# Patient Record
Sex: Female | Born: 1940 | Race: White | Hispanic: No | Smoking: Never smoker
Health system: Southern US, Community
[De-identification: ages and names within clinical notes are randomized; demographics above are authoritative.]

## PROBLEM LIST (undated history)

## (undated) DIAGNOSIS — M1990 Unspecified osteoarthritis, unspecified site: Secondary | ICD-10-CM

## (undated) DIAGNOSIS — Z9109 Other allergy status, other than to drugs and biological substances: Secondary | ICD-10-CM

## (undated) DIAGNOSIS — T4145XA Adverse effect of unspecified anesthetic, initial encounter: Secondary | ICD-10-CM

## (undated) DIAGNOSIS — G2581 Restless legs syndrome: Secondary | ICD-10-CM

## (undated) DIAGNOSIS — M8580 Other specified disorders of bone density and structure, unspecified site: Secondary | ICD-10-CM

## (undated) DIAGNOSIS — F419 Anxiety disorder, unspecified: Secondary | ICD-10-CM

## (undated) DIAGNOSIS — G4700 Insomnia, unspecified: Secondary | ICD-10-CM

## (undated) DIAGNOSIS — N393 Stress incontinence (female) (male): Secondary | ICD-10-CM

## (undated) DIAGNOSIS — T8859XA Other complications of anesthesia, initial encounter: Secondary | ICD-10-CM

## (undated) DIAGNOSIS — H269 Unspecified cataract: Secondary | ICD-10-CM

## (undated) DIAGNOSIS — C4491 Basal cell carcinoma of skin, unspecified: Secondary | ICD-10-CM

## (undated) DIAGNOSIS — D649 Anemia, unspecified: Secondary | ICD-10-CM

## (undated) HISTORY — DX: Unspecified osteoarthritis, unspecified site: M19.90

## (undated) HISTORY — DX: Insomnia, unspecified: G47.00

## (undated) HISTORY — PX: EYE SURGERY: SHX253

## (undated) HISTORY — DX: Other allergy status, other than to drugs and biological substances: Z91.09

## (undated) HISTORY — PX: TONSILLECTOMY: SUR1361

## (undated) HISTORY — DX: Basal cell carcinoma of skin, unspecified: C44.91

## (undated) HISTORY — PX: COLONOSCOPY: SHX174

## (undated) HISTORY — DX: Stress incontinence (female) (male): N39.3

## (undated) HISTORY — DX: Unspecified cataract: H26.9

## (undated) HISTORY — PX: SKIN CANCER EXCISION: SHX779

## (undated) HISTORY — DX: Restless legs syndrome: G25.81

---
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
## 2007-10-01 DIAGNOSIS — M899 Disorder of bone, unspecified: Secondary | ICD-10-CM | POA: Insufficient documentation

## 2007-10-01 DIAGNOSIS — J301 Allergic rhinitis due to pollen: Secondary | ICD-10-CM | POA: Insufficient documentation

## 2007-10-01 DIAGNOSIS — G5600 Carpal tunnel syndrome, unspecified upper limb: Secondary | ICD-10-CM | POA: Insufficient documentation

## 2012-03-02 DIAGNOSIS — H9209 Otalgia, unspecified ear: Secondary | ICD-10-CM | POA: Insufficient documentation

## 2012-03-02 DIAGNOSIS — R42 Dizziness and giddiness: Secondary | ICD-10-CM | POA: Insufficient documentation

## 2012-06-18 DIAGNOSIS — H269 Unspecified cataract: Secondary | ICD-10-CM | POA: Insufficient documentation

## 2012-09-06 LAB — HM COLONOSCOPY

## 2014-04-20 ENCOUNTER — Ambulatory Visit: Payer: Self-pay | Admitting: Obstetrics and Gynecology

## 2014-05-30 DIAGNOSIS — Z66 Do not resuscitate: Secondary | ICD-10-CM | POA: Insufficient documentation

## 2015-08-05 LAB — HM PAP SMEAR: HM Pap smear: NORMAL

## 2015-08-29 ENCOUNTER — Other Ambulatory Visit: Payer: Self-pay | Admitting: Obstetrics and Gynecology

## 2015-08-29 DIAGNOSIS — Z13820 Encounter for screening for osteoporosis: Secondary | ICD-10-CM

## 2015-08-29 DIAGNOSIS — Z1239 Encounter for other screening for malignant neoplasm of breast: Secondary | ICD-10-CM

## 2015-09-11 ENCOUNTER — Ambulatory Visit
Admission: RE | Admit: 2015-09-11 | Discharge: 2015-09-11 | Disposition: A | Discharge status: Home / Self Care | Payer: Medicare Other | Source: Ambulatory Visit | Attending: Obstetrics and Gynecology | Admitting: Obstetrics and Gynecology

## 2015-09-11 ENCOUNTER — Other Ambulatory Visit: Payer: Self-pay | Admitting: Obstetrics and Gynecology

## 2015-09-11 DIAGNOSIS — Z1231 Encounter for screening mammogram for malignant neoplasm of breast: Secondary | ICD-10-CM

## 2015-09-11 DIAGNOSIS — M8580 Other specified disorders of bone density and structure, unspecified site: Secondary | ICD-10-CM | POA: Diagnosis not present

## 2015-09-11 DIAGNOSIS — Z13820 Encounter for screening for osteoporosis: Secondary | ICD-10-CM | POA: Diagnosis present

## 2015-09-11 DIAGNOSIS — Z1239 Encounter for other screening for malignant neoplasm of breast: Secondary | ICD-10-CM

## 2016-02-25 DIAGNOSIS — C4491 Basal cell carcinoma of skin, unspecified: Secondary | ICD-10-CM

## 2016-02-25 HISTORY — DX: Basal cell carcinoma of skin, unspecified: C44.91

## 2016-08-27 ENCOUNTER — Other Ambulatory Visit: Payer: Self-pay | Admitting: Family Medicine

## 2016-08-27 DIAGNOSIS — Z1231 Encounter for screening mammogram for malignant neoplasm of breast: Secondary | ICD-10-CM

## 2016-11-26 ENCOUNTER — Ambulatory Visit
Admission: RE | Admit: 2016-11-26 | Discharge: 2016-11-26 | Disposition: A | Discharge status: Home / Self Care | Payer: Medicare Other | Source: Ambulatory Visit | Attending: Family Medicine | Admitting: Family Medicine

## 2016-11-26 DIAGNOSIS — Z1231 Encounter for screening mammogram for malignant neoplasm of breast: Secondary | ICD-10-CM | POA: Insufficient documentation

## 2016-11-26 LAB — HM MAMMOGRAPHY

## 2016-12-31 ENCOUNTER — Encounter (INDEPENDENT_AMBULATORY_CARE_PROVIDER_SITE_OTHER): Payer: Self-pay

## 2016-12-31 ENCOUNTER — Ambulatory Visit (INDEPENDENT_AMBULATORY_CARE_PROVIDER_SITE_OTHER): Payer: Medicare Other | Admitting: Primary Care

## 2016-12-31 ENCOUNTER — Encounter: Payer: Self-pay | Admitting: Primary Care

## 2016-12-31 VITALS — BP 116/72 | HR 78 | Temp 97.5°F | Ht 65.75 in | Wt 153.5 lb

## 2016-12-31 DIAGNOSIS — G2581 Restless legs syndrome: Secondary | ICD-10-CM | POA: Diagnosis not present

## 2016-12-31 DIAGNOSIS — G4701 Insomnia due to medical condition: Secondary | ICD-10-CM

## 2016-12-31 DIAGNOSIS — N393 Stress incontinence (female) (male): Secondary | ICD-10-CM | POA: Diagnosis not present

## 2016-12-31 DIAGNOSIS — M2550 Pain in unspecified joint: Secondary | ICD-10-CM

## 2016-12-31 DIAGNOSIS — R32 Unspecified urinary incontinence: Secondary | ICD-10-CM | POA: Insufficient documentation

## 2016-12-31 DIAGNOSIS — G4700 Insomnia, unspecified: Secondary | ICD-10-CM | POA: Insufficient documentation

## 2016-12-31 MED ORDER — ZOLPIDEM TARTRATE ER 6.25 MG PO TBCR: 6.2500 mg | EXTENDED_RELEASE_TABLET | Freq: Every evening | ORAL | 0 refills | Status: DC | PRN

## 2016-12-31 MED ORDER — TRAMADOL HCL 50 MG PO TABS: 50.0000 mg | ORAL_TABLET | Freq: Four times a day (QID) | ORAL | 0 refills | Status: DC | PRN

## 2016-12-31 MED ORDER — GABAPENTIN 300 MG PO CAPS: 1200.0000 mg | ORAL_CAPSULE | Freq: Every day | ORAL | 0 refills | Status: DC

## 2016-12-31 MED ORDER — PRAMIPEXOLE DIHYDROCHLORIDE ER 0.375 MG PO TB24: 1.0000 | ORAL_TABLET | Freq: Every day | ORAL | 0 refills | Status: DC

## 2016-12-31 MED ORDER — ALPRAZOLAM 0.5 MG PO TABS: 0.5000 mg | ORAL_TABLET | Freq: Every evening | ORAL | 0 refills | Status: DC | PRN

## 2016-12-31 NOTE — Assessment & Plan Note (Signed)
History of for years, attempted oxybutynin and several other medications without improvement. Will try low-dose Myrbetric. If no improvement then will send to Urology.

## 2016-12-31 NOTE — Assessment & Plan Note (Signed)
Secondary to restless leg syndrome. Discussed never to use Xanax and Ambien together, discouraged use of both. Based off of her bottles and last fill date, she uses these medications infrequently. Refill provided today for short-term supply. Would not recommend she continue to take these medicines given long-term history and tolerance of other opioids.

## 2016-12-31 NOTE — Progress Notes (Signed)
Subjective:    Patient ID: Brittany Browning, female    DOB: 28-Dec-1940, 76 y.o.   MRN: [id number]  HPI  Brittany Browning is a 76 year old female who presents today to establish care and discuss the problems mentioned below. Will obtain old records. Her last [place] was in January 2018.  1) Insomnia: Currently managed on alprazolam 0.5 mg once nightly as needed for sleep and Ambien CR 6.25 mg. She takes 1/2 tablet of alprazolam 2-3 times weekly along with Tussionex. She takes Ambien once weekly on average. She is taken these medicines on an as-needed basis for years. She uses them infrequently overall, but is needing a refill today.  2) Restless Leg Syndrome: Diagnosed over 20 years ago. Currently managed on Mirapex 0.375 ER tablets, Ultracet/Ultram, Gabapentin 300mg . She recently underwent evaluation per pulmonology in [place] who her on a regimen of methadone and hydrocodone in order to restart her regimen as she's had little improvement to RLS symptoms. After miscommunication within the office she decided to leave their practice and establish with [name]. She is currently set up to see Neurology through [facility] on July 20th, 2018.  3) Stress Incontinence: Daily symptoms. Currently following with OB/GYN and taking estradiol 10 mg twice weekly. Previously managed on ditropan without much improvement. Her symptoms of incontinence have progressed gradually, she is interested in restarting treatment.  4) Shoulder Pain: Located to right shoulder for about one year. No recent injury or trauma. She has been evaluated by PT through [facility] and is needing a prescription to continue treatment.   5) Left lower extremity pain/knee pain: Present for the past 10 days. She presented to [facility] and underwent xrays without obvious cause for pain. She does have a history of restless leg syndrome. She would like to see the physical therapist at [facility] for both her shoulder and left lower extremity.  Review  of Systems  Constitutional: Negative for fatigue.  Respiratory: Negative for shortness of breath.   Cardiovascular: Negative for chest pain.  Gastrointestinal: Negative for abdominal pain.  Genitourinary:       Incontinence  Musculoskeletal: Positive for arthralgias.  Allergic/Immunologic: Positive for environmental allergies.  Neurological:       Restless leg syndrome  Hematological: Negative for adenopathy.  Psychiatric/Behavioral: The patient is not nervous/anxious.        Intermittent insomnia secondary to RLS       No past medical history on file.   Social History   Social History  . Marital status: Married    Spouse name: N/A  . Number of children: N/A  . Years of education: N/A   Occupational History  . Not on file.   Social History Main Topics  . Smoking status: Never Smoker  . Smokeless tobacco: Never Used  . Alcohol use No  . Drug use: No  . Sexual activity: Not on file   Other Topics Concern  . Not on file   Social History Narrative  . No narrative on file    No past surgical history on file.  Family History  Problem Relation Age of Onset  . Breast cancer Neg Hx     Allergies  Allergen Reactions  . Sulfa Antibiotics     Reaction in the past (unknown)    No current outpatient prescriptions on file prior to visit.   No current facility-administered medications on file prior to visit.     BP 116/72 (BP Location: Right Arm, Patient Position: Sitting, Cuff Size: Normal)  Pulse 78   Temp 97.5 F (36.4 C) (Oral)   Ht 5' 5.75" (1.67 m)   Wt 153 lb 8 oz (69.6 kg)   SpO2 96%   BMI 24.96 kg/m    Objective:   Physical Exam  Constitutional: She is oriented to person, place, and time. She appears well-nourished.  Neck: Neck supple.  Cardiovascular: Normal rate and regular rhythm.   Pulmonary/Chest: Effort normal and breath sounds normal.  Musculoskeletal:       Right shoulder: She exhibits decreased range of motion and pain. She exhibits  no tenderness.       Left knee: She exhibits decreased range of motion. She exhibits no swelling. No tenderness found.  Neurological: She is alert and oriented to person, place, and time.  Skin: Skin is warm and dry.  Psychiatric: She has a normal mood and affect.          Assessment & Plan:

## 2016-12-31 NOTE — Patient Instructions (Addendum)
Stop by the front desk and speak with either Rosaria Ferries or Shirlean Mylar regarding your referral to Neurology and pain management.  You do not need any additional pneumonia vaccinations.  Take the prescription to the physical therapy department.  Talk with pain management and neurology regarding your current regimen. They will need to take over.  It was a pleasure to meet you today! Please don't hesitate to call me with any questions. Welcome to [facility]!

## 2016-12-31 NOTE — Assessment & Plan Note (Signed)
Located to right shoulder and left knee. Exam overall stable today, prescription for physical therapy provided for her today. She will participate in physical therapy through [facility].

## 2016-12-31 NOTE — Assessment & Plan Note (Signed)
Chronic for 20+ years. Long-term use of Ultram, gabapentin, mirtazapine. Do agree that she needs neurology and/or pain management evaluation for ongoing symptoms despite treatment regimen. Recommended she keep her appointment with neurology in July, referral for pain management placed today. Discussed that I do not prescribe narcotics long-term, she verbalized understanding.

## 2017-01-22 ENCOUNTER — Ambulatory Visit: Payer: Self-pay | Admitting: Neurology

## 2017-02-08 ENCOUNTER — Other Ambulatory Visit: Payer: Self-pay | Admitting: Primary Care

## 2017-02-08 DIAGNOSIS — G2581 Restless legs syndrome: Secondary | ICD-10-CM

## 2017-02-09 NOTE — Telephone Encounter (Signed)
Ok to refill? Electronically refill request for gabapentin (NEURONTIN) 300 MG capsule.  Last prescribed and seen on 12/31/2016.

## 2017-02-09 NOTE — Telephone Encounter (Signed)
Looks like she's currently following with someone through [place]. Please verify this as we received a request for her Gabapentin. This will need to go to her MD at [facility].

## 2017-02-10 NOTE — Telephone Encounter (Signed)
Per DPR, left detail message of Kate's comments for patient to call back. 

## 2017-02-25 ENCOUNTER — Other Ambulatory Visit: Payer: Self-pay | Admitting: Primary Care

## 2017-02-25 DIAGNOSIS — G2581 Restless legs syndrome: Secondary | ICD-10-CM

## 2017-02-25 NOTE — Telephone Encounter (Signed)
Ok to refill? Electronically refill request for Pramipexole Dihydrochloride 0.375 MG TB24  Last prescribed and seen on 12/31/2016.

## 2017-02-25 NOTE — Telephone Encounter (Signed)
This request needs to go to PCP at [facility]. See Care Everywhere for 02/03/17 visit.

## 2017-02-26 ENCOUNTER — Other Ambulatory Visit: Payer: Self-pay | Admitting: Primary Care

## 2017-02-26 DIAGNOSIS — G2581 Restless legs syndrome: Secondary | ICD-10-CM

## 2017-02-26 NOTE — Telephone Encounter (Signed)
Message left for patient to return my call.  

## 2017-03-03 NOTE — Telephone Encounter (Signed)
Message left for patient to return my call.  

## 2017-06-01 ENCOUNTER — Emergency Department
Admission: EM | Admit: 2017-06-01 | Discharge: 2017-06-02 | Disposition: A | Discharge status: Home / Self Care | Payer: Medicare Other | Attending: Emergency Medicine | Admitting: Emergency Medicine

## 2017-06-01 DIAGNOSIS — R110 Nausea: Secondary | ICD-10-CM | POA: Insufficient documentation

## 2017-06-01 DIAGNOSIS — T50905A Adverse effect of unspecified drugs, medicaments and biological substances, initial encounter: Secondary | ICD-10-CM | POA: Diagnosis not present

## 2017-06-01 DIAGNOSIS — Z7982 Long term (current) use of aspirin: Secondary | ICD-10-CM | POA: Diagnosis not present

## 2017-06-01 DIAGNOSIS — Z85828 Personal history of other malignant neoplasm of skin: Secondary | ICD-10-CM | POA: Insufficient documentation

## 2017-06-01 DIAGNOSIS — Y829 Unspecified medical devices associated with adverse incidents: Secondary | ICD-10-CM | POA: Diagnosis not present

## 2017-06-01 DIAGNOSIS — Z79899 Other long term (current) drug therapy: Secondary | ICD-10-CM | POA: Insufficient documentation

## 2017-06-01 DIAGNOSIS — F419 Anxiety disorder, unspecified: Secondary | ICD-10-CM | POA: Diagnosis not present

## 2017-06-01 DIAGNOSIS — T887XXA Unspecified adverse effect of drug or medicament, initial encounter: Secondary | ICD-10-CM | POA: Insufficient documentation

## 2017-06-01 NOTE — ED Provider Notes (Signed)
[facility] Emergency Department Provider Note   ____________________________________________   First MD Initiated Contact with Patient 06/01/17 2351     (approximate)  I have reviewed the triage vital signs and the nursing notes.   HISTORY  Chief Complaint Medication Reaction    HPI Brittany Browning is a 76 y.o. female here for evaluation of feeling "horrible"  Patient reports that she has been on Wellbutrin for about 4 days, she took 2 tablets but today one this morning and 1 this afternoon is developed an uneasy feeling, but she is felt slightly more anxious than usual, had some mild nausea, and reports that this feels very 'off.'  She cannot describe better.  She was recently started on Wellbutrin and methadone for treatment of anxiety and restless leg syndrome.  She reports she suffers with restless legs for her entire life, been seen by multiple specialists, and is currently being followed at [facility]  No headache.  No chest pain.  No abdominal pain.  No tremors.  No shakes.  No hallucinations, not hearing anything.  Denies thoughts about hurting anyone   Past Medical History:  Diagnosis Date  . Arthritis   . Basal cell carcinoma (BCC)   . Cataract   . Environmental allergies   . Insomnia   . Restless leg syndrome   . Stress incontinence     Patient Active Problem List   Diagnosis Date Noted  . Restless leg syndrome 12/31/2016  . Insomnia 12/31/2016  . Urinary incontinence 12/31/2016  . Arthralgia 12/31/2016    Past Surgical History:  Procedure Laterality Date  . TONSILLECTOMY AND ADENOIDECTOMY  1944    Prior to Admission medications   Medication Sig Start Date End Date Taking? Authorizing Provider  5-Hydroxytryptophan (5-HTP) 100 MG CAPS Take 1 tablet by mouth daily.    [provider]  ALPRAZolam Duanne Moron) 0.5 MG tablet Take 1 tablet (0.5 mg total) by mouth at bedtime as needed for sleep. 12/31/16   Pleas Koch, NP  aspirin EC 81 MG tablet Take 1 tablet by mouth 2 (two) times a week. 10/18/07   [provider]  Azelastine HCl 0.15 % SOLN Place into the nose. Place 2 sprays into both nostrils nightly 02/06/16   [provider]  Bioflavonoid Products (ESTER-C) TABS Take 1 tablet by mouth daily.    [provider]  Calcium Carbonate-Vit D-Min (CALCIUM 1200 PO) Take 1 tablet by mouth 2 (two) times daily.    [provider]  Cinnamon 500 MG capsule Take 1 capsule by mouth daily.    [provider]  Coenzyme Q-10 200 MG CAPS Take 1 tablet by mouth daily.    [provider]  Cyanocobalamin 2500 MCG SUBL Place 1 tablet under the tongue daily.    [provider]  Dextromethorphan-Guaifenesin (MUCINEX DM PO) Take 1 tablet by mouth daily as needed.    [provider]  Docusate Calcium (STOOL SOFTENER PO) Take 1 tablet by mouth daily.    [provider]  Estradiol 10 MCG TABS vaginal tablet Place 1 tablet vaginally 2 (two) times a week. 12/19/09   [provider]  folic acid (FOLVITE) 1 MG tablet Take 1 tablet by mouth daily. 10/18/07   [provider]  gabapentin (NEURONTIN) 300 MG capsule Take 4 capsules (1,200 mg total) by mouth at bedtime. 12/31/16   Pleas Koch, NP  Gabapentin Enacarbil 600 MG TBCR Take 1 tablet by mouth daily. 07/09/16   [provider]  Glucosamine-Chondroitin (COSAMIN DS PO) Take 1 tablet by mouth daily.    [provider]  GLUTATHIONE PO Take 1 tablet by mouth daily.    [provider]  HYDROcodone-acetaminophen (NORCO/VICODIN) 5-325 MG tablet hydrocodone 5 mg-acetaminophen 325 mg tablet.  TAKE 1 TABLET BY MOUTH EVERY 12 HOURS AS NEEDED    [provider]  Hypertonic Nasal Wash (SINUS RINSE NA) Place 1 application into the nose daily.    [provider]  ipratropium (ATROVENT HFA) 17 MCG/ACT inhaler Inhale 2 puffs into the lungs every 6  (six) hours.    [provider]  ketotifen (ZADITOR) 0.025 % ophthalmic solution Apply 1 drop to eye 2 (two) times daily as needed.    [provider]  levocetirizine (XYZAL) 5 MG tablet Take 1 tablet by mouth 2 (two) times daily. 02/06/16 02/05/17  [provider]  MAGNESIUM GLUCONATE PO Take 400 mg by mouth 2 (two) times daily.    [provider]  meclizine (ANTIVERT) 12.5 MG tablet Take 1 tablet by mouth 3 (three) times daily as needed.    [provider]  Melatonin 10 MG TABS Take 1 tablet by mouth daily.    [provider]  methadone (DOLOPHINE) 5 MG tablet Take 1 tablet by mouth daily. 12/02/16   [provider]  montelukast (SINGULAIR) 10 MG tablet Take 1 tablet by mouth daily. 10/14/10   [provider]  Multiple Vitamin (MULTIVITAMIN) capsule Take 1 capsule by mouth daily. 10/30/08   [provider]  naproxen (NAPROSYN) 375 MG tablet Take 375 mg by mouth 2 (two) times daily as needed. 12/20/16   [provider]  NONFORMULARY OR COMPOUNDED ITEM Cream with cannabis oil    [provider]  Omega-3 1000 MG CAPS Take 2 tablets by mouth daily. 10/18/07   [provider]  phenazopyridine (PYRIDIUM) 200 MG tablet Take 1 tablet by mouth 2 (two) times daily.    [provider]  Polyethyl Glycol-Propyl Glycol (SYSTANE ULTRA OP) Apply to eye.    [provider]  Pramipexole Dihydrochloride 0.375 MG TB24 Take 1 tablet (0.375 mg total) by mouth daily. 12/31/16   Pleas Koch, NP  sodium citrate-citric acid (ORACIT) 500-334 MG/5ML solution Take 1 mL by mouth daily. 04/25/13   [provider]  traMADol (ULTRAM) 50 MG tablet Take 1 tablet (50 mg total) by mouth every 6 (six) hours as needed for severe pain. 12/31/16   Pleas Koch, NP  traMADol-acetaminophen (ULTRACET) 37.5-325 MG tablet Take 1 tablet by mouth at bedtime.    [provider]  zolpidem (AMBIEN CR)  6.25 MG CR tablet Take 1 tablet (6.25 mg total) by mouth at bedtime as needed for sleep. 12/31/16   Pleas Koch, NP    Allergies Sulfa antibiotics  Family History  Problem Relation Age of Onset  . Breast cancer Neg Hx     Social History Social History  Substance Use Topics  . Smoking status: Never Smoker  . Smokeless tobacco: Never Used  . Alcohol use No    Review of Systems Constitutional: No fever/chills.  Patient reports hard to describe feeling of anxiety Eyes: No visual changes. ENT: No sore throat. Cardiovascular: Denies chest pain. Respiratory: Denies shortness of breath. Gastrointestinal: No abdominal pain.  No vomiting.  No diarrhea.  No constipation. Genitourinary: Negative for dysuria. Musculoskeletal: Negative for back pain. Skin: Negative for rash. Neurological: Negative for headaches, focal weakness or numbness.    ____________________________________________  PHYSICAL EXAM:  VITAL SIGNS: ED Triage Vitals  Enc Vitals Group     BP 06/01/17 2032 127/75     Pulse Rate 06/01/17 2032 (!) 103     Resp 06/01/17 2032 20     Temp 06/01/17 2032 98 F (36.7 C)     Temp Source 06/01/17 2032 Oral     SpO2 06/01/17 2032 94 %     Weight 06/01/17 2033 148 lb (67.1 kg)     Height 06/01/17 2033 5\' 6"  (1.676 m)     Head Circumference --      Peak Flow --      Pain Score --      Pain Loc --      Pain Edu? --      Excl. in [place]? --     Constitutional: Alert and oriented. Well appearing and in no acute distress.  Sitting up, pleasant, appears slightly anxious. Eyes: Conjunctivae are normal. Head: Atraumatic. Nose: No congestion/rhinnorhea. Mouth/Throat: Mucous membranes are moist. Neck: No stridor.   Cardiovascular: Normal rate, regular rhythm. Grossly normal heart sounds.  Good peripheral circulation. Respiratory: Normal respiratory effort.  No retractions. Lungs CTAB. Gastrointestinal: Soft and nontender. No distention. Musculoskeletal: No lower  extremity tenderness nor edema.  No tremors in any extremity.  Normal patellar reflexes bilateral.  No mild clonus.  No ataxia.  No pronator drift.  Walks with normal gait. Neurologic:  Normal speech and language. No gross focal neurologic deficits are appreciated.  Normal cranial nerve exam.  Normal strength in all extremities. Skin:  Skin is warm, dry and intact. No rash noted. Psychiatric: Mood and affect are normal to slightly anxious. Speech and behavior are normal.  ____________________________________________   LABS (all labs ordered are listed, but only abnormal results are displayed)  Labs Reviewed - No data to display ____________________________________________  EKG  Requested and recommended performing EKG the patient, she refused this.  She reports she does not wish for any testing, just to speak to me. ____________________________________________  RADIOLOGY   ____________________________________________   PROCEDURES  Procedure(s) performed: None  Procedures  Critical Care performed: No  ____________________________________________   INITIAL IMPRESSION / ASSESSMENT AND PLAN / ED COURSE  Pertinent labs & imaging results that were available during my care of the patient were reviewed by me and considered in my medical decision making (see chart for details).  Patient presents after developing feeling of anxiety and mild nausea after recently starting Wellbutrin and methadone.  Very reassuring exam without evidence of acute or emergent medical condition.  Patient did not wish for an EKG which I was going to perform to assure no ECG abnormalities, but after discussing with the patient she does not wish to have this done and reassured medical decision making we will not perform.  Counseled the patient, and I suspect this is likely secondary to recently initiating Wellbutrin given the feeling of hard to describe feeling of discomfort, anxiety and some nausea which  are common side effects of Wellbutrin.  Discussed with her as well as her healthcare assistant who is with her, advised her to discontinue Wellbutrin use, advised on careful return precautions, and she will be calling her doctor this morning to obtain further guidance and recommendations regarding her care.  Return precautions and treatment recommendations and follow-up discussed with the patient who is agreeable with the plan.           ____________________________________________   FINAL CLINICAL IMPRESSION(S) / ED DIAGNOSES  Final diagnoses:  Medication side  effect, initial encounter      NEW MEDICATIONS STARTED DURING THIS VISIT:  New Prescriptions   No medications on file     Note:  This document was prepared using Dragon voice recognition software and may include unintentional dictation errors.     Delman Kitten, MD 06/02/17 [phone]

## 2017-06-01 NOTE — ED Triage Notes (Signed)
Patient reports feels she is having a reaction to her medications.  Reports recently started a new medication.  Patient reports "I feel horrible".

## 2017-06-02 NOTE — Discharge Instructions (Signed)
Please call your regular doctor (who prescribed your Wellbutrin) tomorrow morning.  Call your doctor or return to the ED if you have headache, sudden and severe headache, confusion, slurred speech, facial droop, weakness or numbness in any arm or leg, extreme fatigue, vision problems, or other symptoms that concern you.

## 2017-06-02 NOTE — ED Notes (Signed)
Pt told this NA that she does not want an EKG done. Pt states that she has had one done recently and that she thinks her heart is fine.

## 2017-06-23 ENCOUNTER — Ambulatory Visit (INDEPENDENT_AMBULATORY_CARE_PROVIDER_SITE_OTHER): Payer: Medicare Other | Admitting: Family Medicine

## 2017-06-23 ENCOUNTER — Encounter: Payer: Self-pay | Admitting: Family Medicine

## 2017-06-23 VITALS — BP 122/78 | HR 78 | Resp 16 | Ht 66.0 in | Wt 148.0 lb

## 2017-06-23 DIAGNOSIS — G4701 Insomnia due to medical condition: Secondary | ICD-10-CM

## 2017-06-23 DIAGNOSIS — F439 Reaction to severe stress, unspecified: Secondary | ICD-10-CM | POA: Insufficient documentation

## 2017-06-23 DIAGNOSIS — G8929 Other chronic pain: Secondary | ICD-10-CM | POA: Insufficient documentation

## 2017-06-23 DIAGNOSIS — M810 Age-related osteoporosis without current pathological fracture: Secondary | ICD-10-CM | POA: Insufficient documentation

## 2017-06-23 DIAGNOSIS — M549 Dorsalgia, unspecified: Secondary | ICD-10-CM

## 2017-06-23 DIAGNOSIS — E559 Vitamin D deficiency, unspecified: Secondary | ICD-10-CM | POA: Insufficient documentation

## 2017-06-23 DIAGNOSIS — N393 Stress incontinence (female) (male): Secondary | ICD-10-CM | POA: Insufficient documentation

## 2017-06-23 DIAGNOSIS — F419 Anxiety disorder, unspecified: Secondary | ICD-10-CM | POA: Diagnosis not present

## 2017-06-23 DIAGNOSIS — G2581 Restless legs syndrome: Secondary | ICD-10-CM | POA: Diagnosis not present

## 2017-06-23 DIAGNOSIS — J309 Allergic rhinitis, unspecified: Secondary | ICD-10-CM

## 2017-06-23 DIAGNOSIS — R2681 Unsteadiness on feet: Secondary | ICD-10-CM | POA: Diagnosis not present

## 2017-06-23 DIAGNOSIS — M1990 Unspecified osteoarthritis, unspecified site: Secondary | ICD-10-CM | POA: Insufficient documentation

## 2017-06-23 DIAGNOSIS — E7800 Pure hypercholesterolemia, unspecified: Secondary | ICD-10-CM | POA: Insufficient documentation

## 2017-06-23 DIAGNOSIS — R0789 Other chest pain: Secondary | ICD-10-CM | POA: Insufficient documentation

## 2017-06-23 MED ORDER — TRAMADOL HCL 50 MG PO TABS: 50.0000 mg | ORAL_TABLET | Freq: Three times a day (TID) | ORAL | 0 refills | Status: DC | PRN

## 2017-06-23 NOTE — Patient Instructions (Signed)
Take methadone 2.5 mg in the morning and 5 mg before bedtime.

## 2017-06-24 ENCOUNTER — Telehealth: Payer: Self-pay | Admitting: Family Medicine

## 2017-06-24 ENCOUNTER — Other Ambulatory Visit: Payer: Self-pay | Admitting: Family Medicine

## 2017-06-24 ENCOUNTER — Emergency Department
Admission: EM | Admit: 2017-06-24 | Discharge: 2017-06-24 | Disposition: A | Discharge status: Home / Self Care | Payer: Medicare Other | Attending: Emergency Medicine | Admitting: Emergency Medicine

## 2017-06-24 ENCOUNTER — Other Ambulatory Visit: Payer: Self-pay

## 2017-06-24 ENCOUNTER — Encounter: Payer: Self-pay | Admitting: Emergency Medicine

## 2017-06-24 DIAGNOSIS — Z85828 Personal history of other malignant neoplasm of skin: Secondary | ICD-10-CM | POA: Insufficient documentation

## 2017-06-24 DIAGNOSIS — F419 Anxiety disorder, unspecified: Secondary | ICD-10-CM | POA: Insufficient documentation

## 2017-06-24 DIAGNOSIS — Z66 Do not resuscitate: Secondary | ICD-10-CM | POA: Insufficient documentation

## 2017-06-24 DIAGNOSIS — Z87891 Personal history of nicotine dependence: Secondary | ICD-10-CM | POA: Insufficient documentation

## 2017-06-24 DIAGNOSIS — J309 Allergic rhinitis, unspecified: Secondary | ICD-10-CM | POA: Insufficient documentation

## 2017-06-24 DIAGNOSIS — G2581 Restless legs syndrome: Secondary | ICD-10-CM | POA: Insufficient documentation

## 2017-06-24 HISTORY — DX: Restless legs syndrome: G25.81

## 2017-06-24 LAB — VITAMIN B12: Vitamin B-12: >2000 pg/mL — ABNORMAL HIGH (ref 232–1245)

## 2017-06-24 LAB — VITAMIN D 25 HYDROXY (VIT D DEFICIENCY, FRACTURES): Vit D, 25-Hydroxy: 41.1 ng/mL (ref 30.0–100.0)

## 2017-06-24 MED ORDER — B-12 1000 MCG PO TABS: 1.0000 | ORAL_TABLET | Freq: Every day | ORAL | Status: DC

## 2017-06-24 NOTE — Telephone Encounter (Signed)
I called patient after hearing that she was having anxiety and suicidal thoughts. I called to see if she was indeed going to hospital and if she had a plan on these suicidal thoughts. Patient was talking normal but did report having only slept 1 hour. She said Methadone was causing her to have nausea and be nervous and severe agitation. She said she has to take it for the Restless Leg issues because it is all that helps. I asked if she was having thoughts of harming herself or anyone else as yesterday she did not report this when asked. She said she had Methadone in system yesterday and now that she does not have it in her system she is having these thoughts. I asked does she have a plan on how to end her life. Her response was that she had many plans that would end her life and did indeed think of these all night. I explained to her the process that we go through and asked her to unlock her door for sheriff. She was then asked to tell them her story about restless leg and the medications that have caused some of these behaviors. She is comfortable with this and asked if they will send EMS. I explained that they will after doing welfare check and they will take her to [facility]. She asked about [facility] and I said all you can do is ask but I do believe they have to stay in county. She had a ride but due to protocol and safety reasoning I asked that she cancel that ride and allow EMS to be sure she gets to hospital. I also alerted Dr.Plonk as well as talked to manager Amy who after this all then entered in Safety Portal.

## 2017-06-24 NOTE — Telephone Encounter (Signed)
I spoke to pt advising her to go to [facility] for further evaluation by psychiatry. She said "I can't take this anymore, the methadone is the only thing that helps, but it gives me anxiety too bad." I told her that none of the physicians in this office will prescribe methadone for her. Also, while she was at [facility], they have specialists on call that could see her such as a neurologist. Maybe while she was there, she could also see a neurologist that could come up with a better solution to her restless leg than the methadone. She wanted to know if someone there with her could drive her. I told her she didn't need to drive down. She wanted to know if "I take an ambulance will that get me in quicker?" I explained to her that "there is no guarantee that you will get in quicker by ambulance, but you still need to go." She said she would go and understood that she should go to [facility] not [facility].  Once we discovered the office manager knew about this and "enter it into the safety portal," the sheriff was called. Patient is being told she has to go to the nearest hospital by the sheriff, which would be [facility]. She has been discussing [facility] this morning with Estill Bamberg and myself, which she has agreed to got to. Now, she is being told [facility]...she may end up being taken to [facility] by her husband if she refuses [place]. It is more important for patient to agree to care and go to the hospital she feels more comfortable with, as we want what is best for her.

## 2017-06-24 NOTE — Progress Notes (Signed)
Date:  06/23/2017   Name:  Brittany Browning   DOB:  1941/07/17   MRN:  [id number]  PCP:  Marcial Pacas, MD    Chief Complaint: Advice Only (Interested in medication management. Taking Methadone for Restless Leg 5mg . Advised that we do not write this. PCP Dr. Lenna Gilford. ) and Restless Leg   History of Present Illness:  This is a 75 y.o. female seen for consultation. PCP is Dr. Lenna Gilford at [facility]. Reports RLS x 20 years, has seen multiple specialists and tried multiple meds, seen recently at [facility], has appt for eval at [facility]. On methadone since August, also Neupro patch and [facility] ER, taking Vicodin for breakthrough sxs. Methadone made anxious, Wellbutrin gave panic attack, now on Lexapro. Requip/Klonopin ineffective. On multiple supplements for unclear reasons, Singular and Zyzal for allergies, melatonin and prn Xanax for insomnia. Feels methadone most effective, wants to increase dose. Father died old age 65, mother died 7 CVA/heart dz, also had RLS. Imms UTD, colonoscopy 5 yrs ago showed polyps, mammo normal this year.  Review of Systems:  Review of Systems  Constitutional: Negative for chills and fever.  Respiratory: Negative for cough and shortness of breath.   Cardiovascular: Negative for chest pain and leg swelling.  Gastrointestinal: Negative for abdominal pain.  Genitourinary: Negative for difficulty urinating.  Neurological: Negative for syncope and light-headedness.    Patient Active Problem List   Diagnosis Date Noted  . Allergic rhinitis 06/24/2017  . Anxiety disorder 06/24/2017  . Osteoarthritis 06/23/2017  . Atypical chest pain 06/23/2017  . Chronic back pain 06/23/2017  . Female stress incontinence 06/23/2017  . Hypercholesterolemia 06/23/2017  . Osteoporosis 06/23/2017  . Situational stress 06/23/2017  . Vitamin D deficiency 06/23/2017  . Gait instability 06/23/2017  . Restless legs syndrome 12/31/2016  . Insomnia 12/31/2016  .  Urinary incontinence 12/31/2016  . Arthralgia 12/31/2016  . DNAR (do not attempt resuscitation) 05/30/2014  . Cataract 06/18/2012  . Dizziness 03/02/2012  . Referred otalgia 03/02/2012    Prior to Admission medications   Medication Sig Start Date End Date Taking? Authorizing Provider  5-Hydroxytryptophan (5-HTP) 100 MG CAPS Take 1 tablet by mouth daily.   Yes [provider]  ALPRAZolam (XANAX) 0.5 MG tablet Take 1 tablet (0.5 mg total) by mouth at bedtime as needed for sleep. 12/31/16  Yes Pleas Koch, NP  Azelastine HCl 0.15 % SOLN Place into the nose. Place 2 sprays into both nostrils nightly 02/06/16  Yes [provider]  Bioflavonoid Products (ESTER-C) TABS Take 1 tablet by mouth daily.   Yes [provider]  Calcium Carbonate-Vit D-Min (CALCIUM 1200 PO) Take 1 tablet by mouth 2 (two) times daily.   Yes [provider]  Cinnamon 500 MG capsule Take 1 capsule by mouth daily.   Yes [provider]  Cyanocobalamin 2500 MCG SUBL Place 1 tablet under the tongue daily.   Yes [provider]  Docusate Calcium (STOOL SOFTENER PO) Take 1 tablet by mouth daily.   Yes [provider]  Estradiol 10 MCG TABS vaginal tablet Place 1 tablet vaginally 2 (two) times a week. 12/19/09  Yes [provider]  folic acid (FOLVITE) 1 MG tablet Take 1 tablet by mouth daily. 10/18/07  Yes [provider]  Glucosamine-Chondroitin (COSAMIN DS PO) Take 1 tablet by mouth daily.   Yes [provider]  Hypertonic Nasal Wash (SINUS RINSE NA) Place 1 application into the nose daily.   Yes [provider]  ipratropium (ATROVENT HFA) 17 MCG/ACT inhaler Inhale 2 puffs into the lungs every 6 (six) hours.   Yes [provider]  ketotifen (ZADITOR) 0.025 % ophthalmic solution Apply 1 drop to eye 2 (two) times daily as needed.   Yes [provider]  levocetirizine (XYZAL) 5 MG tablet Take 1 tablet by mouth 2  (two) times daily. 02/06/16 06/23/17 Yes [provider]  MAGNESIUM GLUCONATE PO Take 400 mg by mouth 2 (two) times daily.   Yes [provider]  meclizine (ANTIVERT) 12.5 MG tablet Take 1 tablet by mouth 3 (three) times daily as needed.   Yes [provider]  methadone (DOLOPHINE) 5 MG tablet Take 1 tablet by mouth daily. 12/02/16  Yes [provider]  montelukast (SINGULAIR) 10 MG tablet Take 1 tablet by mouth daily. 10/14/10  Yes [provider]  Multiple Vitamin (MULTIVITAMIN) capsule Take 1 capsule by mouth daily. 10/30/08  Yes [provider]  Omega-3 1000 MG CAPS Take 2 tablets by mouth daily. 10/18/07  Yes [provider]  Rotigotine (NEUPRO) 1 MG/24HR PT24 Place onto the skin.   Yes [provider]  sodium citrate-citric acid (ORACIT) 500-334 MG/5ML solution Take 1 mL by mouth daily. 04/25/13  Yes [provider]  Melatonin 10 MG TABS Take 1 tablet by mouth daily.    [provider]  NONFORMULARY OR COMPOUNDED ITEM Cream with cannabis oil    [provider]  traMADol (ULTRAM) 50 MG tablet Take 1 tablet (50 mg total) by mouth every 8 (eight) hours as needed. 06/23/17   Adline Potter, MD    Allergies  Allergen Reactions  . Sulfa Antibiotics     Reaction in the past (unknown)  . Bupropion Anxiety    Aggitation, nausea, light headed    Past Surgical History:  Procedure Laterality Date  . TONSILLECTOMY AND ADENOIDECTOMY  1944    Social History   Tobacco Use  . Smoking status: Never Smoker  . Smokeless tobacco: Never Used  Substance Use Topics  . Alcohol use: No  . Drug use: No    Family History  Problem Relation Age of Onset  . Breast cancer Neg Hx     Medication list has been reviewed and updated.  Physical Examination: BP 122/78   Pulse 78   Resp 16   Ht 5\' 6"  (1.676 m)   Wt 148 lb (67.1 kg)   SpO2 98%   BMI 23.89 kg/m   Physical Exam  Constitutional: She is  oriented to person, place, and time. She appears well-developed and well-nourished.  HENT:  Head: Normocephalic and atraumatic.  Right Ear: External ear normal.  Left Ear: External ear normal.  Nose: Nose normal.  Mouth/Throat: Oropharynx is clear and moist.  TMs clear  Eyes: Conjunctivae and EOM are normal. Pupils are equal, round, and reactive to light.  Neck: Neck supple. No thyromegaly present.  Cardiovascular: Normal rate, regular rhythm and normal heart sounds.  Pulmonary/Chest: Effort normal and breath sounds normal.  Abdominal: Soft. She exhibits no distension and no mass. There is no tenderness.  Musculoskeletal: She exhibits no edema.  Lymphadenopathy:    She has no cervical adenopathy.  Neurological: She is alert and oriented to person, place, and time. Coordination normal.  Gait unsteady  Skin: Skin is warm and dry.  Psychiatric: Her behavior is normal.  Anxious affect  Nursing note and vitals reviewed.   Assessment and Plan:  1. Restless legs syndrome Highly invested in dx, has seen multiple  specialists, marginal control on methadone, Neupro patch, and [facility] ER, feels methadone most effective, ok to try increase to 2.5 mg qam and 5 mg qpm but explained I do not prescribe chronic opioids outside terminal illness, change Vicodin to tramadol prn breakthrough - Ambulatory referral to Neurology  2. Anxiety disorder, unspecified type Unclear if medication induced, marginal control on Lexapro  3. Insomnia due to medical condition Marginal control on melatonin/prn Xanax  4. Gait instability - B12  5. Allergic rhinitis, unspecified seasonality, unspecified trigger Well controlled on Singulair/Xyzal  6. Osteoarthritis, unspecified osteoarthritis type, unspecified site Cont Cosamin, avoid NSAIDS  7. Vitamin D deficiency On supplement - Vitamin D (25 hydroxy)  Return in about 4 weeks (around 07/21/2017).   One hour spent with patient over half in  counseling  Satira Anis. Kenidy Crossland, [facility]  06/24/2017

## 2017-06-24 NOTE — ED Provider Notes (Signed)
[facility] Emergency Department Provider Note   ____________________________________________   I have reviewed the triage vital signs and the nursing notes.   HISTORY  Chief Complaint Restless leg syndrome  History limited by: Not Limited   HPI Brittany Browning is a 76 y.o. female who presents to the emergency department today because of concern for restless leg syndrome.   LOCATION:bilateral legs DURATION:years TIMING: fairly constant SEVERITY: severe QUALITY: pain CONTEXT: patient states she has had restless leg syndrome for years. Has seen multiple doctors including doctor in [place] who is apparently a specialist in this. Has been put on multiple medication. States that she has not been able to get good sleep with this. States that she was told she should go to [facility] where they could help her. Unclear why EMS was called to transport her to [facility] when she had a ride to [facility] lined up.  MODIFYING FACTORS: has traditionally been improved with methadone ASSOCIATED SYMPTOMS: feelings of hopelessness.   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  Past Medical History:  Diagnosis Date  . Arthritis   . Basal cell carcinoma (BCC)   . Cataract   . Environmental allergies   . Insomnia   . Restless leg syndrome   . Stress incontinence   . Willis-Ekbom syndrome     Patient Active Problem List   Diagnosis Date Noted  . Osteoarthritis 06/23/2017  . Atypical chest pain 06/23/2017  . Chronic back pain 06/23/2017  . Female stress incontinence 06/23/2017  . Hypercholesterolemia 06/23/2017  . Osteoporosis 06/23/2017  . Situational stress 06/23/2017  . Vitamin D deficiency 06/23/2017  . Gait instability 06/23/2017  . Restless legs syndrome 12/31/2016  . Insomnia 12/31/2016  . Urinary incontinence 12/31/2016  . Arthralgia 12/31/2016  . DNAR (do not attempt resuscitation) 05/30/2014  . Cataract 06/18/2012  . Dizziness  03/02/2012  . Referred otalgia 03/02/2012    Past Surgical History:  Procedure Laterality Date  . TONSILLECTOMY AND ADENOIDECTOMY  1944    Prior to Admission medications   Medication Sig Start Date End Date Taking? Authorizing Provider  5-Hydroxytryptophan (5-HTP) 100 MG CAPS Take 1 tablet by mouth daily.    [provider]  ALPRAZolam Duanne Moron) 0.5 MG tablet Take 1 tablet (0.5 mg total) by mouth at bedtime as needed for sleep. 12/31/16   Pleas Koch, NP  Azelastine HCl 0.15 % SOLN Place into the nose. Place 2 sprays into both nostrils nightly 02/06/16   [provider]  Bioflavonoid Products (ESTER-C) TABS Take 1 tablet by mouth daily.    [provider]  Calcium Carbonate-Vit D-Min (CALCIUM 1200 PO) Take 1 tablet by mouth 2 (two) times daily.    [provider]  Cinnamon 500 MG capsule Take 1 capsule by mouth daily.    [provider]  Cyanocobalamin 2500 MCG SUBL Place 1 tablet under the tongue daily.    [provider]  Docusate Calcium (STOOL SOFTENER PO) Take 1 tablet by mouth daily.    [provider]  Estradiol 10 MCG TABS vaginal tablet Place 1 tablet vaginally 2 (two) times a week. 12/19/09   [provider]  folic acid (FOLVITE) 1 MG tablet Take 1 tablet by mouth daily. 10/18/07   [provider]  Glucosamine-Chondroitin (COSAMIN DS PO) Take 1 tablet by mouth daily.    [provider]  Hypertonic Nasal Wash (SINUS RINSE NA) Place 1 application into the nose daily.    [provider]  ipratropium (ATROVENT HFA) 17 MCG/ACT inhaler Inhale 2 puffs into the lungs every 6 (six) hours.    [provider]  ketotifen (ZADITOR) 0.025 % ophthalmic solution Apply 1 drop to eye 2 (two) times daily as needed.    [provider]  levocetirizine (XYZAL) 5 MG tablet Take 1 tablet by mouth 2 (two) times daily. 02/06/16 06/23/17  [provider]  MAGNESIUM GLUCONATE PO Take  400 mg by mouth 2 (two) times daily.    [provider]  meclizine (ANTIVERT) 12.5 MG tablet Take 1 tablet by mouth 3 (three) times daily as needed.    [provider]  Melatonin 10 MG TABS Take 1 tablet by mouth daily.    [provider]  methadone (DOLOPHINE) 5 MG tablet Take 1 tablet by mouth daily. 12/02/16   [provider]  montelukast (SINGULAIR) 10 MG tablet Take 1 tablet by mouth daily. 10/14/10   [provider]  Multiple Vitamin (MULTIVITAMIN) capsule Take 1 capsule by mouth daily. 10/30/08   [provider]  NONFORMULARY OR COMPOUNDED ITEM Cream with cannabis oil    [provider]  Omega-3 1000 MG CAPS Take 2 tablets by mouth daily. 10/18/07   [provider]  Rotigotine (NEUPRO) 1 MG/24HR PT24 Place onto the skin.    [provider]  sodium citrate-citric acid (ORACIT) 500-334 MG/5ML solution Take 1 mL by mouth daily. 04/25/13   [provider]  traMADol (ULTRAM) 50 MG tablet Take 1 tablet (50 mg total) by mouth every 8 (eight) hours as needed. 06/23/17   Plonk, Gwyndolyn Saxon, MD    Allergies Sulfa antibiotics and Bupropion  Family History  Problem Relation Age of Onset  . Breast cancer Neg Hx     Social History Social History   Tobacco Use  . Smoking status: Never Smoker  . Smokeless tobacco: Never Used  Substance Use Topics  . Alcohol use: No  . Drug use: No    Review of Systems Constitutional: No fever/chills Eyes: No visual changes. ENT: No sore throat. Cardiovascular: Denies chest pain. Respiratory: Denies shortness of breath. Gastrointestinal: No abdominal pain.  No nausea, no vomiting.  No diarrhea.   Genitourinary: Negative for dysuria. Musculoskeletal: Positive for bilateral lower extremity pain. Skin: Negative for rash. Neurological: Negative for headaches, focal weakness or numbness.  ____________________________________________   PHYSICAL EXAM:  VITAL SIGNS: ED  Triage Vitals  Enc Vitals Group     BP 06/24/17 0945 116/76     Pulse Rate 06/24/17 0945 82     Resp 06/24/17 0945 18     Temp 06/24/17 0945 (!) 97.3 F (36.3 C)     Temp Source 06/24/17 0945 Oral     SpO2 06/24/17 0945 99 %     Weight 06/24/17 0946 146 lb (66.2 kg)     Height 06/24/17 0946 5\' 6"  (1.676 m)   Constitutional: Alert and oriented. Well appearing and in no distress. Eyes: Conjunctivae are normal.  ENT   Head: Normocephalic and atraumatic.   Nose: No congestion/rhinnorhea.   Mouth/Throat: Mucous membranes are moist.   Neck: No stridor. Hematological/Lymphatic/Immunilogical: No cervical lymphadenopathy. Cardiovascular: Normal rate, regular rhythm.  No murmurs, rubs, or gallops. Respiratory: Normal respiratory effort without tachypnea nor retractions. Breath sounds are clear and equal bilaterally. No wheezes/rales/rhonchi. Gastrointestinal: Soft and non tender. No rebound. No guarding.  Genitourinary: Deferred Musculoskeletal: Normal range of motion in all extremities. No lower extremity edema. Neurologic:  Normal speech and language. No gross focal  neurologic deficits are appreciated.  Skin:  Skin is warm, dry and intact. No rash noted. Psychiatric: Denies SI  ____________________________________________    LABS (pertinent positives/negatives)  None  ____________________________________________   EKG  None  ____________________________________________    RADIOLOGY  None  ____________________________________________   PROCEDURES  Procedures  ____________________________________________   INITIAL IMPRESSION / ASSESSMENT AND PLAN / ED COURSE  Pertinent labs & imaging results that were available during my care of the patient were reviewed by me and considered in my medical decision making (see chart for details).  Patient presented to the emergency department today via EMS with primary complaint of restless leg syndrome.  Slightly  unclear why EMS was called today.  Patient had arranged to be transported [facility] per recommendation of her primary care physician.  It does sound like the patient has been having thoughts of hopelessness.  I do wonder if when she mentioned to the staff that she would sometimes rather not be here than left with continued pain they were concerned for suicidal ideation.  However in discussion with her she denies any current suicidal ideation. Patient has not thought of any plans to harm herself and is not thought of actually harming herself.  At this time I do not feel like patient is a threat to herself.  Discussed with patient that here in the emergency department would be unable to offer the patient more for her restless leg syndrome and her specialist primary care doctor.  I do feel patient is safe for discharge.  Patient was able to stand up and walk without distress.  ____________________________________________   FINAL CLINICAL IMPRESSION(S) / ED DIAGNOSES  Final diagnoses:  Restless leg syndrome     Note: This dictation was prepared with Dragon dictation. Any transcriptional errors that result from this process are unintentional     Nance Pear, MD 06/24/17 1103

## 2017-06-24 NOTE — Telephone Encounter (Signed)
EMS called Brittany Browning advised why we wanted her to go to [facility] for Neuro consult after Psych. She was advised to get ride to [facility] if refusing [facility].  Patient called me and said [name] has no clue what to do and she is leaving to go to [name]. I asked how and she said she has a ride now.

## 2017-06-24 NOTE — Telephone Encounter (Signed)
Patient called complaining of suicide thoughts last night, she said she needed to talk with the nurse or Dr Vicente Masson about the current medications she's on. Which she takes for restless leg syndrome. She complains its not helping, she's tried everything and wants to just end her life. I advised her if she feels the need of hurting herself that she needs to go to the emergency room or call 911 and explain everything to them. Baxter Flattery the nurse for dr Ronnald Ramp spoke with the patient explaining that she is to go to the [employer], tara also stated that she is not to drive, patient stated that she may have someone that could take her to the hospital.

## 2017-06-24 NOTE — Discharge Instructions (Signed)
Please seek medical attention for any high fevers, chest pain, shortness of breath, change in behavior, persistent vomiting, bloody stool or any other new or concerning symptoms.  

## 2017-06-24 NOTE — ED Triage Notes (Signed)
Pt c/o Brittany Browning and has not slept since august. Has tried multiple medications.  "I just want some place, I am just ready to check out, I cant go on like this".  When asked about SI pt states "I just slept for 1 hour last night and I cant do this." pt wants to go to [facility].

## 2017-07-15 ENCOUNTER — Encounter: Payer: Self-pay | Admitting: Family Medicine

## 2017-07-15 ENCOUNTER — Ambulatory Visit (INDEPENDENT_AMBULATORY_CARE_PROVIDER_SITE_OTHER): Payer: Medicare Other | Admitting: Family Medicine

## 2017-07-15 VITALS — BP 122/82 | HR 83 | Resp 16 | Ht 66.0 in | Wt 142.0 lb

## 2017-07-15 DIAGNOSIS — F329 Major depressive disorder, single episode, unspecified: Secondary | ICD-10-CM | POA: Diagnosis not present

## 2017-07-15 DIAGNOSIS — Z79899 Other long term (current) drug therapy: Secondary | ICD-10-CM

## 2017-07-15 DIAGNOSIS — F32A Depression, unspecified: Secondary | ICD-10-CM | POA: Insufficient documentation

## 2017-07-15 DIAGNOSIS — J309 Allergic rhinitis, unspecified: Secondary | ICD-10-CM

## 2017-07-15 DIAGNOSIS — E538 Deficiency of other specified B group vitamins: Secondary | ICD-10-CM | POA: Diagnosis not present

## 2017-07-15 DIAGNOSIS — G2581 Restless legs syndrome: Secondary | ICD-10-CM

## 2017-07-16 DIAGNOSIS — Z79899 Other long term (current) drug therapy: Secondary | ICD-10-CM | POA: Insufficient documentation

## 2017-07-16 DIAGNOSIS — E538 Deficiency of other specified B group vitamins: Secondary | ICD-10-CM | POA: Insufficient documentation

## 2017-07-16 NOTE — Progress Notes (Signed)
Date:  07/15/2017   Name:  Brittany Browning   DOB:  04/04/1941   MRN:  [id number]  PCP:  Marcial Pacas, MD    Chief Complaint: Leg Pain (restless leg..Saw Dr. Marcie Bal in [place] who is researching her case. He feels she is the worst case.Was given methadone and then Suboxone and now given Zubsolv as a lower dose for the restless leg since addicts have similar symptoms in legs she is at a lost. Wants a nurse or facility to watch her and administer meds until finding the correct dose to keep her from risk of something happening. ) and Depression (see telephone messages as patient was threatening suicide in November. )   History of Present Illness:  This is a 76 y.o. female seen for one month f/u from initial visit. Saw RLS specialist at [facility], changed methadone to Suboxone but caused excess sedation, changed to lower dose Zubsolv, reluctant to take without observation given previous sedation. Suboxone did seem to help RLS. Remains on Neupro patch and Horizant. Depression/anxiety worse off Lexapro, seen ER for suicidal ideation last month. Continues to be followed by Dr. Louie Bun FM and Dr. Algis Downs neuro. Remains on multiple vits/supplements she is not interested in stopping.  Review of Systems:  Review of Systems  Constitutional: Negative for chills and fever.  Respiratory: Negative for cough.   Cardiovascular: Negative for chest pain and leg swelling.  Genitourinary: Negative for difficulty urinating.  Neurological: Negative for syncope and light-headedness.    Patient Active Problem List   Diagnosis Date Noted  . B12 deficiency 07/16/2017  . Depression 07/15/2017  . Allergic rhinitis 06/24/2017  . Anxiety 06/24/2017  . Osteoarthritis 06/23/2017  . Atypical chest pain 06/23/2017  . Chronic back pain 06/23/2017  . Female stress incontinence 06/23/2017  . Hypercholesterolemia 06/23/2017  . Osteoporosis 06/23/2017  . Situational stress 06/23/2017  . Vitamin D  deficiency 06/23/2017  . Gait instability 06/23/2017  . RLS (restless legs syndrome) 12/31/2016  . Insomnia 12/31/2016  . Urinary incontinence 12/31/2016  . Arthralgia 12/31/2016  . DNAR (do not attempt resuscitation) 05/30/2014  . Cataract 06/18/2012  . Dizziness 03/02/2012  . Referred otalgia 03/02/2012    Prior to Admission medications   Medication Sig Start Date End Date Taking? Authorizing Provider  5-Hydroxytryptophan (5-HTP) 100 MG CAPS Take 1 tablet by mouth daily.   Yes [provider]  ALPRAZolam (XANAX) 0.5 MG tablet Take 1 tablet (0.5 mg total) by mouth at bedtime as needed for sleep. 12/31/16  Yes Pleas Koch, NP  Azelastine HCl 0.15 % SOLN Place into the nose. Place 2 sprays into both nostrils nightly 02/06/16  Yes [provider]  Bioflavonoid Products (ESTER-C) TABS Take 1 tablet by mouth daily.   Yes [provider]  Buprenorphine HCl-Naloxone HCl 0.7-0.18 MG SUBL Place 0.5 tablets under the tongue 2 (two) times daily.   Yes [provider]  Calcium Carbonate-Vit D-Min (CALCIUM 1200 PO) Take 1 tablet by mouth 2 (two) times daily.   Yes [provider]  Cinnamon 500 MG capsule Take 1 capsule by mouth daily.   Yes [provider]  Cyanocobalamin (B-12) 1000 MCG TABS Take 1 tablet by mouth daily. 06/24/17  Yes Pepper Wyndham, Gwyndolyn Saxon, MD  Docusate Calcium (STOOL SOFTENER PO) Take 1 tablet by mouth daily.   Yes [provider]  folic acid (FOLVITE) 1 MG tablet Take 1 tablet by mouth daily. 10/18/07  Yes [provider]  gabapentin (NEURONTIN) 300 MG capsule  Take 300 mg by mouth 3 (three) times daily.   Yes [provider]  Glucosamine-Chondroitin (COSAMIN DS PO) Take 1 tablet by mouth daily.   Yes [provider]  Hypertonic Nasal Wash (SINUS RINSE NA) Place 1 application into the nose daily.   Yes [provider]  ipratropium (ATROVENT HFA) 17 MCG/ACT inhaler Inhale 2 puffs into the  lungs every 6 (six) hours as needed.   Yes [provider]  ketotifen (ZADITOR) 0.025 % ophthalmic solution Apply 1 drop to eye 2 (two) times daily as needed.   Yes [provider]  MAGNESIUM GLUCONATE PO Take 400 mg by mouth 2 (two) times daily.   Yes [provider]  meclizine (ANTIVERT) 12.5 MG tablet Take 1 tablet by mouth 3 (three) times daily as needed.   Yes [provider]  montelukast (SINGULAIR) 10 MG tablet Take 1 tablet by mouth daily. 10/14/10  Yes [provider]  Multiple Vitamin (MULTIVITAMIN) capsule Take 1 capsule by mouth daily. 10/30/08  Yes [provider]  NONFORMULARY OR COMPOUNDED ITEM Cream with cannabis oil   Yes [provider]  Omega-3 1000 MG CAPS Take 2 tablets by mouth daily. 10/18/07  Yes [provider]  Rotigotine (NEUPRO) 1 MG/24HR PT24 Place onto the skin.   Yes [provider]  sodium citrate-citric acid (ORACIT) 500-334 MG/5ML solution Take 1 mL by mouth daily. 04/25/13  Yes [provider]  levocetirizine (XYZAL) 5 MG tablet Take 1 tablet by mouth 2 (two) times daily. 02/06/16 06/23/17  [provider]    Allergies  Allergen Reactions  . Sulfa Antibiotics     Reaction in the past (unknown)  . Bupropion Anxiety    Aggitation, nausea, light headed    Past Surgical History:  Procedure Laterality Date  . TONSILLECTOMY AND ADENOIDECTOMY  1944    Social History   Tobacco Use  . Smoking status: Never Smoker  . Smokeless tobacco: Never Used  Substance Use Topics  . Alcohol use: No  . Drug use: No    Family History  Problem Relation Age of Onset  . Breast cancer Neg Hx     Medication list has been reviewed and updated.  Physical Examination: BP 122/82   Pulse 83   Resp 16   Ht 5\' 6"  (1.676 m)   Wt 142 lb (64.4 kg)   SpO2 98%   BMI 22.92 kg/m   Physical Exam  Constitutional: She appears well-developed and well-nourished.  Cardiovascular:  Normal rate, regular rhythm and normal heart sounds.  Pulmonary/Chest: Effort normal and breath sounds normal.  Musculoskeletal: She exhibits no edema.  Neurological: She is alert.  Skin: Skin is warm and dry.  Psychiatric:  Anxious affect, pacing around room  Nursing note and vitals reviewed.   Assessment and Plan:  1. RLS (restless legs syndrome) Complicated case for which I have little additional to offer. Advised beginning Subsolv 0.5 tablet bid to limit side effects, take first dose under observation for first hour, titrate as tolerated, limit care to single specialist  2. Depression, unspecified depression type Worse off Lexapro, recommend restart, consider SNRI if not tried in past  3. Allergic rhinitis, unspecified seasonality, unspecified trigger Cont Singulair/Xyzal  4. B12 deficiency Advised d/c supplement given high level  5. Polypharmacy Advised d/c multiple supplements to simplify medical regimen and avoid interactions, pt not interested  Return if symptoms worsen or fail to improve.  Satira Anis. [facility]  07/16/2017

## 2017-07-17 DIAGNOSIS — G4733 Obstructive sleep apnea (adult) (pediatric): Secondary | ICD-10-CM | POA: Insufficient documentation

## 2017-08-05 ENCOUNTER — Ambulatory Visit: Payer: Medicare Other | Admitting: Family Medicine

## 2017-08-05 DIAGNOSIS — I872 Venous insufficiency (chronic) (peripheral): Secondary | ICD-10-CM | POA: Insufficient documentation

## 2017-10-06 ENCOUNTER — Encounter: Payer: Self-pay | Admitting: *Deleted

## 2017-10-20 ENCOUNTER — Ambulatory Visit: Payer: Medicare Other | Admitting: Certified Registered Nurse Anesthetist

## 2017-10-20 ENCOUNTER — Ambulatory Visit
Admission: RE | Admit: 2017-10-20 | Discharge: 2017-10-20 | Disposition: A | Discharge status: Home / Self Care | Payer: Medicare Other | Source: Ambulatory Visit | Attending: Ophthalmology | Admitting: Ophthalmology

## 2017-10-20 ENCOUNTER — Encounter
Admission: RE | Disposition: A | Discharge status: Home / Self Care | Payer: Self-pay | Source: Ambulatory Visit | Attending: Ophthalmology

## 2017-10-20 ENCOUNTER — Encounter: Payer: Self-pay | Admitting: Certified Registered Nurse Anesthetist

## 2017-10-20 DIAGNOSIS — F329 Major depressive disorder, single episode, unspecified: Secondary | ICD-10-CM | POA: Diagnosis not present

## 2017-10-20 DIAGNOSIS — Z7982 Long term (current) use of aspirin: Secondary | ICD-10-CM | POA: Insufficient documentation

## 2017-10-20 DIAGNOSIS — M8580 Other specified disorders of bone density and structure, unspecified site: Secondary | ICD-10-CM | POA: Insufficient documentation

## 2017-10-20 DIAGNOSIS — M1990 Unspecified osteoarthritis, unspecified site: Secondary | ICD-10-CM | POA: Insufficient documentation

## 2017-10-20 DIAGNOSIS — G2581 Restless legs syndrome: Secondary | ICD-10-CM | POA: Diagnosis not present

## 2017-10-20 DIAGNOSIS — G4700 Insomnia, unspecified: Secondary | ICD-10-CM | POA: Insufficient documentation

## 2017-10-20 DIAGNOSIS — E739 Lactose intolerance, unspecified: Secondary | ICD-10-CM | POA: Insufficient documentation

## 2017-10-20 DIAGNOSIS — H2512 Age-related nuclear cataract, left eye: Secondary | ICD-10-CM | POA: Insufficient documentation

## 2017-10-20 DIAGNOSIS — G4730 Sleep apnea, unspecified: Secondary | ICD-10-CM | POA: Insufficient documentation

## 2017-10-20 DIAGNOSIS — Z882 Allergy status to sulfonamides status: Secondary | ICD-10-CM | POA: Diagnosis not present

## 2017-10-20 DIAGNOSIS — Z888 Allergy status to other drugs, medicaments and biological substances status: Secondary | ICD-10-CM | POA: Diagnosis not present

## 2017-10-20 DIAGNOSIS — Z9989 Dependence on other enabling machines and devices: Secondary | ICD-10-CM | POA: Diagnosis not present

## 2017-10-20 DIAGNOSIS — Z85828 Personal history of other malignant neoplasm of skin: Secondary | ICD-10-CM | POA: Diagnosis not present

## 2017-10-20 DIAGNOSIS — Z79899 Other long term (current) drug therapy: Secondary | ICD-10-CM | POA: Insufficient documentation

## 2017-10-20 DIAGNOSIS — F419 Anxiety disorder, unspecified: Secondary | ICD-10-CM | POA: Insufficient documentation

## 2017-10-20 HISTORY — DX: Other specified disorders of bone density and structure, unspecified site: M85.80

## 2017-10-20 HISTORY — DX: Other complications of anesthesia, initial encounter: T88.59XA

## 2017-10-20 HISTORY — PX: CATARACT EXTRACTION W/PHACO: SHX586

## 2017-10-20 HISTORY — DX: Adverse effect of unspecified anesthetic, initial encounter: T41.45XA

## 2017-10-20 HISTORY — DX: Anemia, unspecified: D64.9

## 2017-10-20 HISTORY — DX: Anxiety disorder, unspecified: F41.9

## 2017-10-20 SURGERY — PHACOEMULSIFICATION, CATARACT, WITH IOL INSERTION
Anesthesia: Monitor Anesthesia Care | Site: Eye | Laterality: Left | Wound class: Clean

## 2017-10-20 MED ORDER — LIDOCAINE HCL (PF) 4 % IJ SOLN
INTRAMUSCULAR | Status: AC
Start: 2017-10-20 — End: ?
  Filled 2017-10-20: qty 5

## 2017-10-20 MED ORDER — MOXIFLOXACIN HCL 0.5 % OP SOLN
OPHTHALMIC | Status: AC
  Filled 2017-10-20: qty 3

## 2017-10-20 MED ORDER — ARMC OPHTHALMIC DILATING DROPS
1.0000 "application " | OPHTHALMIC | Status: AC
  Administered 2017-10-20 (×2): 1 via OPHTHALMIC

## 2017-10-20 MED ORDER — SODIUM CHLORIDE 0.9 % IV SOLN
INTRAVENOUS | Status: DC
  Administered 2017-10-20: 10:00:00 via INTRAVENOUS

## 2017-10-20 MED ORDER — BSS IO SOLN
INTRAOCULAR | Status: DC | PRN
  Administered 2017-10-20: 4 mL via OPHTHALMIC

## 2017-10-20 MED ORDER — POVIDONE-IODINE 5 % OP SOLN
OPHTHALMIC | Status: AC
Start: 2017-10-20 — End: ?
  Filled 2017-10-20: qty 30

## 2017-10-20 MED ORDER — NA CHONDROIT SULF-NA HYALURON 40-17 MG/ML IO SOLN
INTRAOCULAR | Status: DC | PRN
  Administered 2017-10-20: 1 mL via INTRAOCULAR

## 2017-10-20 MED ORDER — CARBACHOL 0.01 % IO SOLN
INTRAOCULAR | Status: DC | PRN
  Administered 2017-10-20: 0.5 mL via INTRAOCULAR

## 2017-10-20 MED ORDER — MOXIFLOXACIN HCL 0.5 % OP SOLN: 1.0000 [drp] | Freq: Once | OPHTHALMIC | Status: DC

## 2017-10-20 MED ORDER — EPINEPHRINE PF 1 MG/ML IJ SOLN
INTRAOCULAR | Status: DC | PRN
  Administered 2017-10-20: 11:00:00 via OPHTHALMIC

## 2017-10-20 MED ORDER — ARMC OPHTHALMIC DILATING DROPS
OPHTHALMIC | Status: AC
  Administered 2017-10-20: 1 via OPHTHALMIC
  Filled 2017-10-20: qty 0.4

## 2017-10-20 MED ORDER — FENTANYL CITRATE (PF) 100 MCG/2ML IJ SOLN
INTRAMUSCULAR | Status: DC | PRN
  Administered 2017-10-20: 25 ug via INTRAVENOUS
  Administered 2017-10-20: 50 ug via INTRAVENOUS
  Administered 2017-10-20: 25 ug via INTRAVENOUS

## 2017-10-20 MED ORDER — EPINEPHRINE PF 1 MG/ML IJ SOLN
INTRAMUSCULAR | Status: AC
  Filled 2017-10-20: qty 2

## 2017-10-20 MED ORDER — NA CHONDROIT SULF-NA HYALURON 40-17 MG/ML IO SOLN
INTRAOCULAR | Status: AC
  Filled 2017-10-20: qty 1

## 2017-10-20 MED ORDER — FENTANYL CITRATE (PF) 100 MCG/2ML IJ SOLN
INTRAMUSCULAR | Status: AC
  Filled 2017-10-20: qty 2

## 2017-10-20 MED ORDER — POVIDONE-IODINE 5 % OP SOLN
OPHTHALMIC | Status: DC | PRN
  Administered 2017-10-20: 1 via OPHTHALMIC

## 2017-10-20 MED ORDER — ONDANSETRON HCL 4 MG/2ML IJ SOLN
INTRAMUSCULAR | Status: AC
  Filled 2017-10-20: qty 2

## 2017-10-20 MED ORDER — ONDANSETRON HCL 4 MG/2ML IJ SOLN
INTRAMUSCULAR | Status: DC | PRN
  Administered 2017-10-20: 4 mg via INTRAVENOUS

## 2017-10-20 MED ORDER — MOXIFLOXACIN HCL 0.5 % OP SOLN
OPHTHALMIC | Status: DC | PRN
  Administered 2017-10-20: 0.2 mL via OPHTHALMIC

## 2017-10-20 SURGICAL SUPPLY — 16 items
GLOVE BIO SURGEON STRL SZ8 (GLOVE) ×2 IMPLANT
GLOVE BIOGEL M 6.5 STRL (GLOVE) ×2 IMPLANT
GLOVE SURG LX 8.0 MICRO (GLOVE) ×1
GLOVE SURG LX STRL 8.0 MICRO (GLOVE) ×1 IMPLANT
GOWN STRL REUS W/ TWL LRG LVL3 (GOWN DISPOSABLE) ×2 IMPLANT
GOWN STRL REUS W/TWL LRG LVL3 (GOWN DISPOSABLE) ×2
LABEL CATARACT MEDS ST (LABEL) ×2 IMPLANT
LENS IOL TECNIS ITEC 20.0 (Intraocular Lens) ×2 IMPLANT
PACK CATARACT (MISCELLANEOUS) ×2 IMPLANT
PACK CATARACT BRASINGTON LX (MISCELLANEOUS) ×2 IMPLANT
PACK EYE AFTER SURG (MISCELLANEOUS) ×2 IMPLANT
SOL BSS BAG (MISCELLANEOUS) ×2
SOLUTION BSS BAG (MISCELLANEOUS) ×1 IMPLANT
SYR 5ML LL (SYRINGE) ×2 IMPLANT
WATER STERILE IRR 250ML POUR (IV SOLUTION) ×2 IMPLANT
WIPE NON LINTING 3.25X3.25 (MISCELLANEOUS) ×2 IMPLANT

## 2017-10-20 NOTE — Op Note (Signed)
PREOPERATIVE DIAGNOSIS:  Nuclear sclerotic cataract of the left eye.   POSTOPERATIVE DIAGNOSIS:  Nuclear sclerotic cataract of the left eye.   OPERATIVE PROCEDURE: Procedure(s): CATARACT EXTRACTION PHACO AND INTRAOCULAR LENS PLACEMENT (IOC)   SURGEON:  Birder Robson, MD.   ANESTHESIA:  Anesthesiologist: Emmie Niemann, MD CRNA: Eben Burow, CRNA  1.      Managed anesthesia care. 2.     0.51ml of Shugarcaine was instilled following the paracentesis   COMPLICATIONS:  None.   TECHNIQUE:   Stop and chop   DESCRIPTION OF PROCEDURE:  The patient was examined and consented in the preoperative holding area where the aforementioned topical anesthesia was applied to the left eye and then brought back to the Operating Room where the left eye was prepped and draped in the usual sterile ophthalmic fashion and a lid speculum was placed. A paracentesis was created with the side port blade and the anterior chamber was filled with viscoelastic. A near clear corneal incision was performed with the steel keratome. A continuous curvilinear capsulorrhexis was performed with a cystotome followed by the capsulorrhexis forceps. Hydrodissection and hydrodelineation were carried out with BSS on a blunt cannula. The lens was removed in a stop and chop  technique and the remaining cortical material was removed with the irrigation-aspiration handpiece. The capsular bag was inflated with viscoelastic and the Technis [id number] lens was placed in the capsular bag without complication. The remaining viscoelastic was removed from the eye with the irrigation-aspiration handpiece. The wounds were hydrated. The anterior chamber was flushed with Miostat and the eye was inflated to physiologic pressure. 0.75ml Vigamox was placed in the anterior chamber. The wounds were found to be water tight. The eye was dressed with Vigamox. The patient was given protective glasses to wear throughout the day and a shield with which to sleep  tonight. The patient was also given drops with which to begin a drop regimen today and will follow-up with me in one day. Implant Name Type Inv. Item Serial No. Manufacturer Lot No. LRB No. Used  LENS IOL DIOP 20.0 - [id number] Intraocular Lens LENS IOL DIOP 20.0 [id number] AMO  Left 1    Procedure(s) with comments: CATARACT EXTRACTION PHACO AND INTRAOCULAR LENS PLACEMENT (IOC) (Left) - [name] 00:39.7 AP% 12.9 CDE 5.09 Fluid Pack Lot # [id number]  Electronically signed: Birder Robson 10/20/2017 11:14 AM

## 2017-10-20 NOTE — Anesthesia Postprocedure Evaluation (Signed)
Anesthesia Post Note  Patient: Brittany Browning  Procedure(s) Performed: CATARACT EXTRACTION PHACO AND INTRAOCULAR LENS PLACEMENT (IOC) (Left Eye)  Patient location during evaluation: PACU Anesthesia Type: MAC Level of consciousness: awake and alert and oriented Pain management: pain level controlled Vital Signs Assessment: post-procedure vital signs reviewed and stable Respiratory status: spontaneous breathing, nonlabored ventilation and respiratory function stable Cardiovascular status: blood pressure returned to baseline and stable Postop Assessment: no signs of nausea or vomiting Anesthetic complications: no     Last Vitals:  Vitals:   10/20/17 1114 10/20/17 1122  BP: 106/63 108/70  Pulse: 63 64  Resp: 16 16  Temp: (!) 36.2 C   SpO2: 97% 98%    Last Pain:  Vitals:   10/20/17 1114  TempSrc: Temporal                 Nailani Full

## 2017-10-20 NOTE — Anesthesia Preprocedure Evaluation (Signed)
Anesthesia Evaluation  Patient identified by MRN, date of birth, ID band Patient awake    Reviewed: Allergy & Precautions, NPO status , Patient's Chart, lab work & pertinent test results  History of Anesthesia Complications Negative for: history of anesthetic complications  Airway Mallampati: II  TM Distance: >3 FB Neck ROM: Full    Dental no notable dental hx.    Pulmonary neg pulmonary ROS, sleep apnea (does not consistently wear CPAP, says it's for her RLS) and Continuous Positive Airway Pressure Ventilation , neg COPD,    breath sounds clear to auscultation- rhonchi (-) wheezing      Cardiovascular Exercise Tolerance: Good (-) hypertension(-) CAD, (-) Past MI, (-) Cardiac Stents and (-) CABG  Rhythm:Regular Rate:Normal - Systolic murmurs and - Diastolic murmurs    Neuro/Psych PSYCHIATRIC DISORDERS Anxiety Depression negative neurological ROS     GI/Hepatic negative GI ROS, Neg liver ROS,   Endo/Other  negative endocrine ROSneg diabetes  Renal/GU negative Renal ROS     Musculoskeletal  (+) Arthritis ,   Abdominal (+) - obese,   Peds  Hematology  (+) anemia ,   Anesthesia Other Findings Past Medical History: No date: Anemia No date: Anxiety No date: Arthritis     Comment:  osteoarthritis No date: Basal cell carcinoma (BCC) No date: Cataract No date: Complication of anesthesia     Comment:  has had problems in the past. 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   Reproductive/Obstetrics                             Anesthesia Physical Anesthesia Plan  ASA: II  Anesthesia Plan: MAC   Post-op Pain Management:    Induction: Intravenous  PONV Risk Score and Plan: 2 and Midazolam  Airway Management Planned: Natural Airway  Additional  Equipment:   Intra-op Plan:   Post-operative Plan:   Informed Consent: I have reviewed the patients History and Physical, chart, labs and discussed the procedure including the risks, benefits and alternatives for the proposed anesthesia with the patient or authorized representative who has indicated his/her understanding and acceptance.     Plan Discussed with: CRNA and Anesthesiologist  Anesthesia Plan Comments:         Anesthesia Quick Evaluation

## 2017-10-20 NOTE — Discharge Instructions (Signed)
FOLLOW DR. PORFILIO'S POSTOP EYE DROP INSTRUCTION SHEET AS REVIEWED.  Eye Surgery Discharge Instructions  Expect mild scratchy sensation or mild soreness. DO NOT RUB YOUR EYE!  The day of surgery:  Minimal physical activity, but bed rest is not required  No reading, computer work, or close hand work  No bending, lifting, or straining.  May watch TV  For 24 hours:  No driving, legal decisions, or alcoholic beverages  Safety precautions  Eat anything you prefer: It is better to start with liquids, then soup then solid foods.  _____ Eye patch should be worn until postoperative exam tomorrow.  ____ Solar shield eyeglasses should be worn for comfort in the sunlight/patch while sleeping  Resume all regular medications including aspirin or Coumadin if these were discontinued prior to surgery. You may shower, bathe, shave, or wash your hair. Tylenol may be taken for mild discomfort.  Call your doctor if you experience significant pain, nausea, or vomiting, fever > 101 or other signs of infection. [phone] or [phone] Specific instructions:  Follow-up Information    Birder Robson, MD Follow up.   Specialty:  Ophthalmology Why:  Wed 10/21/17 @ 10:25 am Contact information: [place] [address] [phone]

## 2017-10-20 NOTE — Transfer of Care (Signed)
Immediate Anesthesia Transfer of Care Note  Patient: Brittany Browning  Procedure(s) Performed: CATARACT EXTRACTION PHACO AND INTRAOCULAR LENS PLACEMENT (IOC) (Left Eye)  Patient Location: Short Stay  Anesthesia Type:MAC  Level of Consciousness: awake, alert , oriented and patient cooperative  Airway & Oxygen Therapy: Patient Spontanous Breathing  Post-op Assessment: Report given to RN and Post -op Vital signs reviewed and stable  Post vital signs: Reviewed and stable  Last Vitals:  Vitals:   10/20/17 0948 10/20/17 1114  BP: 126/73 106/63  Pulse: 85 63  Resp: 16 16  Temp: (!) 36.3 C (!) 36.2 C  SpO2: 97% 97%    Last Pain:  Vitals:   10/20/17 1114  TempSrc: Temporal         Complications: No apparent anesthesia complications

## 2017-10-20 NOTE — Anesthesia Post-op Follow-up Note (Signed)
Anesthesia QCDR form completed.        

## 2017-10-20 NOTE — H&P (Signed)
All labs reviewed. Abnormal studies sent to patients PCP when indicated.  Previous H&P reviewed, patient examined, there are NO CHANGES.  Brittany Capote Porfilio3/19/201910:53 AM

## 2017-10-21 ENCOUNTER — Other Ambulatory Visit: Payer: Self-pay | Admitting: Surgery

## 2017-10-21 ENCOUNTER — Ambulatory Visit
Admission: RE | Admit: 2017-10-21 | Discharge: 2017-10-21 | Disposition: A | Discharge status: Home / Self Care | Payer: Medicare Other | Source: Ambulatory Visit | Attending: Surgery | Admitting: Surgery

## 2017-10-21 DIAGNOSIS — S93692A Other sprain of left foot, initial encounter: Secondary | ICD-10-CM | POA: Diagnosis present

## 2017-12-03 ENCOUNTER — Other Ambulatory Visit: Payer: Self-pay | Admitting: Family Medicine

## 2017-12-03 DIAGNOSIS — Z780 Asymptomatic menopausal state: Secondary | ICD-10-CM

## 2017-12-03 DIAGNOSIS — M8580 Other specified disorders of bone density and structure, unspecified site: Secondary | ICD-10-CM

## 2018-02-09 ENCOUNTER — Ambulatory Visit: Payer: Medicare Other | Admitting: Family Medicine

## 2018-02-18 ENCOUNTER — Ambulatory Visit
Admission: RE | Admit: 2018-02-18 | Discharge: 2018-02-18 | Disposition: A | Discharge status: Home / Self Care | Payer: Medicare Other | Source: Ambulatory Visit | Attending: Family Medicine | Admitting: Family Medicine

## 2018-02-18 DIAGNOSIS — M8580 Other specified disorders of bone density and structure, unspecified site: Secondary | ICD-10-CM | POA: Diagnosis present

## 2018-02-18 DIAGNOSIS — Z780 Asymptomatic menopausal state: Secondary | ICD-10-CM | POA: Diagnosis not present

## 2018-02-26 ENCOUNTER — Ambulatory Visit (INDEPENDENT_AMBULATORY_CARE_PROVIDER_SITE_OTHER): Payer: Medicare Other | Admitting: Podiatry

## 2018-02-26 ENCOUNTER — Encounter

## 2018-02-26 ENCOUNTER — Ambulatory Visit: Payer: Medicare Other

## 2018-02-26 ENCOUNTER — Encounter: Payer: Self-pay | Admitting: Podiatry

## 2018-02-26 DIAGNOSIS — L989 Disorder of the skin and subcutaneous tissue, unspecified: Secondary | ICD-10-CM | POA: Diagnosis not present

## 2018-02-26 NOTE — Patient Instructions (Signed)
AmLactin Lotion two times daily

## 2018-02-27 NOTE — Progress Notes (Signed)
   Subjective: 77 year old female presenting today with a chief complaint of stabbing pain secondary to a painful callus lesion noted on the medial aspect of the right hallux that has been present for several years. Walking increases the pain. She has been applying an OTC salve and filing the area down for treatment. Patient is here for further evaluation and treatment.   Past Medical History:  Diagnosis Date  . Anemia   . Anxiety   . Arthritis    osteoarthritis  . Basal cell carcinoma (BCC)   . Cataract   . Complication of anesthesia    has had problems in the past. can tolerate propofol  . Environmental allergies   . Insomnia   . Osteopenia   . Restless leg syndrome   . Stress incontinence   . Willis-Ekbom syndrome     Objective:  Physical Exam General: Alert and oriented x3 in no acute distress  Dermatology: Hyperkeratotic lesion present on the right hallux. Pain on palpation with a central nucleated core noted.  Skin is warm, dry and supple bilateral lower extremities. Negative for open lesions or macerations.  Vascular: Palpable pedal pulses bilaterally. No edema or erythema noted. Capillary refill within normal limits.  Neurological: Epicritic and protective threshold diminished bilaterally.   Musculoskeletal Exam: Pain on palpation at the keratotic lesion noted. Range of motion within normal limits bilateral. Muscle strength 5/5 in all groups bilateral.  Assessment: #1 Pre-ulcerative callus lesion x 1 right hallux   Plan of Care:  #1 Patient evaluated #2 Excisional debridement of keratotic lesion using a chisel blade was performed without incident.  #3 Dressed area with light dressing. #4 Recommended OTC AmLactin lotion to area twice daily.  #5 Patient is to return to the clinic PRN.    Edrick Kins, DPM [facility]  Dr. Edrick Kins, [address]                                        [place], [place] [address]                  Office [phone]  Fax [phone]

## 2018-06-21 DIAGNOSIS — R0902 Hypoxemia: Secondary | ICD-10-CM | POA: Insufficient documentation

## 2018-08-06 ENCOUNTER — Telehealth: Payer: Self-pay | Admitting: Family Medicine

## 2018-08-06 NOTE — Telephone Encounter (Signed)
error 

## 2018-08-30 ENCOUNTER — Ambulatory Visit (INDEPENDENT_AMBULATORY_CARE_PROVIDER_SITE_OTHER): Payer: Medicare Other | Admitting: Psychology

## 2018-08-30 DIAGNOSIS — F418 Other specified anxiety disorders: Secondary | ICD-10-CM

## 2018-09-15 ENCOUNTER — Ambulatory Visit: Payer: Medicare Other | Admitting: Internal Medicine

## 2018-09-21 ENCOUNTER — Ambulatory Visit (INDEPENDENT_AMBULATORY_CARE_PROVIDER_SITE_OTHER): Payer: Medicare Other | Admitting: Psychology

## 2018-09-21 DIAGNOSIS — F418 Other specified anxiety disorders: Secondary | ICD-10-CM | POA: Diagnosis not present

## 2018-09-24 DIAGNOSIS — M8589 Other specified disorders of bone density and structure, multiple sites: Secondary | ICD-10-CM | POA: Insufficient documentation

## 2018-10-05 ENCOUNTER — Ambulatory Visit (INDEPENDENT_AMBULATORY_CARE_PROVIDER_SITE_OTHER): Payer: Medicare Other | Admitting: Psychology

## 2018-10-05 DIAGNOSIS — F418 Other specified anxiety disorders: Secondary | ICD-10-CM

## 2018-10-21 ENCOUNTER — Other Ambulatory Visit: Payer: Self-pay

## 2018-10-21 ENCOUNTER — Ambulatory Visit (INDEPENDENT_AMBULATORY_CARE_PROVIDER_SITE_OTHER): Payer: Medicare Other | Admitting: Psychology

## 2018-10-21 DIAGNOSIS — F418 Other specified anxiety disorders: Secondary | ICD-10-CM

## 2018-10-25 DIAGNOSIS — G4734 Idiopathic sleep related nonobstructive alveolar hypoventilation: Secondary | ICD-10-CM | POA: Insufficient documentation

## 2018-11-05 ENCOUNTER — Ambulatory Visit (INDEPENDENT_AMBULATORY_CARE_PROVIDER_SITE_OTHER): Payer: Medicare Other | Admitting: Psychology

## 2018-11-05 DIAGNOSIS — F418 Other specified anxiety disorders: Secondary | ICD-10-CM | POA: Diagnosis not present

## 2018-11-19 ENCOUNTER — Ambulatory Visit (INDEPENDENT_AMBULATORY_CARE_PROVIDER_SITE_OTHER): Payer: Medicare Other | Admitting: Psychology

## 2018-11-19 DIAGNOSIS — F418 Other specified anxiety disorders: Secondary | ICD-10-CM

## 2018-12-02 ENCOUNTER — Ambulatory Visit (INDEPENDENT_AMBULATORY_CARE_PROVIDER_SITE_OTHER): Payer: Medicare Other | Admitting: Psychology

## 2018-12-02 DIAGNOSIS — F418 Other specified anxiety disorders: Secondary | ICD-10-CM

## 2018-12-14 ENCOUNTER — Ambulatory Visit: Payer: Self-pay | Admitting: Family Medicine

## 2018-12-16 ENCOUNTER — Ambulatory Visit (INDEPENDENT_AMBULATORY_CARE_PROVIDER_SITE_OTHER): Payer: Medicare Other | Admitting: Psychology

## 2018-12-16 DIAGNOSIS — F418 Other specified anxiety disorders: Secondary | ICD-10-CM

## 2018-12-30 ENCOUNTER — Ambulatory Visit (INDEPENDENT_AMBULATORY_CARE_PROVIDER_SITE_OTHER): Payer: Medicare Other | Admitting: Psychology

## 2018-12-30 DIAGNOSIS — F418 Other specified anxiety disorders: Secondary | ICD-10-CM | POA: Diagnosis not present

## 2019-01-14 ENCOUNTER — Ambulatory Visit: Payer: Self-pay | Admitting: Psychology

## 2019-01-14 ENCOUNTER — Ambulatory Visit: Payer: Medicare Other | Admitting: Psychology

## 2019-01-17 ENCOUNTER — Ambulatory Visit (INDEPENDENT_AMBULATORY_CARE_PROVIDER_SITE_OTHER): Payer: Medicare Other | Admitting: Psychology

## 2019-01-17 ENCOUNTER — Ambulatory Visit: Payer: Medicare Other | Admitting: Psychology

## 2019-01-17 DIAGNOSIS — F418 Other specified anxiety disorders: Secondary | ICD-10-CM | POA: Diagnosis not present

## 2019-01-27 ENCOUNTER — Ambulatory Visit (INDEPENDENT_AMBULATORY_CARE_PROVIDER_SITE_OTHER): Payer: Medicare Other | Admitting: Psychology

## 2019-01-27 DIAGNOSIS — F418 Other specified anxiety disorders: Secondary | ICD-10-CM | POA: Diagnosis not present

## 2019-02-03 ENCOUNTER — Ambulatory Visit (INDEPENDENT_AMBULATORY_CARE_PROVIDER_SITE_OTHER): Payer: Medicare Other | Admitting: Psychology

## 2019-02-03 DIAGNOSIS — F418 Other specified anxiety disorders: Secondary | ICD-10-CM

## 2019-02-15 ENCOUNTER — Ambulatory Visit (INDEPENDENT_AMBULATORY_CARE_PROVIDER_SITE_OTHER): Payer: Medicare Other | Admitting: Psychology

## 2019-02-15 DIAGNOSIS — F418 Other specified anxiety disorders: Secondary | ICD-10-CM

## 2019-02-19 ENCOUNTER — Ambulatory Visit: Payer: Medicare Other | Admitting: Psychology

## 2019-03-02 ENCOUNTER — Ambulatory Visit (INDEPENDENT_AMBULATORY_CARE_PROVIDER_SITE_OTHER): Payer: Medicare Other | Admitting: Psychology

## 2019-03-02 DIAGNOSIS — F418 Other specified anxiety disorders: Secondary | ICD-10-CM

## 2019-03-12 ENCOUNTER — Emergency Department: Payer: Medicare Other

## 2019-03-12 ENCOUNTER — Emergency Department
Admission: EM | Admit: 2019-03-12 | Discharge: 2019-03-12 | Disposition: A | Discharge status: Home / Self Care | Payer: Medicare Other | Attending: Student | Admitting: Student

## 2019-03-12 ENCOUNTER — Other Ambulatory Visit: Payer: Self-pay

## 2019-03-12 DIAGNOSIS — W0110XA Fall on same level from slipping, tripping and stumbling with subsequent striking against unspecified object, initial encounter: Secondary | ICD-10-CM | POA: Insufficient documentation

## 2019-03-12 DIAGNOSIS — Z79899 Other long term (current) drug therapy: Secondary | ICD-10-CM | POA: Diagnosis not present

## 2019-03-12 DIAGNOSIS — S52612A Displaced fracture of left ulna styloid process, initial encounter for closed fracture: Secondary | ICD-10-CM | POA: Diagnosis not present

## 2019-03-12 DIAGNOSIS — Q7192 Unspecified reduction defect of left upper limb: Secondary | ICD-10-CM

## 2019-03-12 DIAGNOSIS — Y999 Unspecified external cause status: Secondary | ICD-10-CM | POA: Insufficient documentation

## 2019-03-12 DIAGNOSIS — Y929 Unspecified place or not applicable: Secondary | ICD-10-CM | POA: Insufficient documentation

## 2019-03-12 DIAGNOSIS — Z7982 Long term (current) use of aspirin: Secondary | ICD-10-CM | POA: Diagnosis not present

## 2019-03-12 DIAGNOSIS — S52502A Unspecified fracture of the lower end of left radius, initial encounter for closed fracture: Secondary | ICD-10-CM | POA: Insufficient documentation

## 2019-03-12 DIAGNOSIS — S6982XA Other specified injuries of left wrist, hand and finger(s), initial encounter: Secondary | ICD-10-CM | POA: Diagnosis present

## 2019-03-12 DIAGNOSIS — Y939 Activity, unspecified: Secondary | ICD-10-CM | POA: Diagnosis not present

## 2019-03-12 DIAGNOSIS — S62102A Fracture of unspecified carpal bone, left wrist, initial encounter for closed fracture: Secondary | ICD-10-CM

## 2019-03-12 MED ORDER — LIDOCAINE HCL (PF) 1 % IJ SOLN
5.0000 mL | Freq: Once | INTRAMUSCULAR | Status: DC
  Filled 2019-03-12: qty 5

## 2019-03-12 NOTE — Discharge Instructions (Signed)
You have had your wrist fracture reduced and splinted in place. Keep the splint clean and dry. Follow-up with Dr. Mack Guise for further fracture care. Take your home meds as directed.

## 2019-03-12 NOTE — ED Triage Notes (Signed)
Pt presents via POV from [facility] c/o left wrist pain s/p fall.

## 2019-03-12 NOTE — ED Notes (Addendum)
Pt fx bone in foot Thursday - she lost her balance today d/t boot putting her off balance - she fell on left wrist and says "I broke it"

## 2019-03-12 NOTE — ED Provider Notes (Addendum)
[facility] Emergency Department Provider Note ____________________________________________  Time seen: 1035  I have reviewed the triage vital signs and the nursing notes.  HISTORY  Chief Complaint  Wrist Pain  HPI Brittany Browning is a 78 y.o. female presents to the ED from [facility]. She reports a mechanical fall resulting in left wrist deformity. She is currently in a CAM walker  for a left foot fracture. She describes tripping due to the CAM walker, and falling on an outstretched left hand. She presents with left wrist deformity and pain. She denies any other injury at this time. She is without head injury, LOC, chest pain, SOB, or abdominal pain.   Past Medical History:  Diagnosis Date  . Anemia   . Anxiety   . Arthritis    osteoarthritis  . Basal cell carcinoma (BCC)   . Cataract   . Complication of anesthesia    has had problems in the past. can tolerate propofol  . Environmental allergies   . Insomnia   . Osteopenia   . Restless leg syndrome   . Stress incontinence   . Willis-Ekbom syndrome     Patient Active Problem List   Diagnosis Date Noted  . Venous reflux 08/05/2017  . Obstructive sleep apnea 07/17/2017  . B12 deficiency 07/16/2017  . Polypharmacy 07/16/2017  . Depression 07/15/2017  . Allergic rhinitis 06/24/2017  . Anxiety 06/24/2017  . Osteoarthritis 06/23/2017  . Atypical chest pain 06/23/2017  . Chronic back pain 06/23/2017  . Female stress incontinence 06/23/2017  . Hypercholesterolemia 06/23/2017  . Osteoporosis 06/23/2017  . Situational stress 06/23/2017  . Vitamin D deficiency 06/23/2017  . Gait instability 06/23/2017  . RLS (restless legs syndrome) 12/31/2016  . Insomnia 12/31/2016  . Urinary incontinence 12/31/2016  . Arthralgia 12/31/2016  . DNAR (do not attempt resuscitation) 05/30/2014  . Cataract 06/18/2012  . Dizziness 03/02/2012  . Referred otalgia 03/02/2012    Past Surgical History:  Procedure  Laterality Date  . CATARACT EXTRACTION W/PHACO Left 10/20/2017   Procedure: CATARACT EXTRACTION PHACO AND INTRAOCULAR LENS PLACEMENT (IOC);  Surgeon: Birder Robson, MD;  Location: [facility] ORS;  Service: Ophthalmology;  Laterality: Left;  [name] 00:39.7 AP% 12.9 CDE 5.09 Fluid Pack Lot # [id number]  . COLONOSCOPY    . SKIN CANCER EXCISION    . TONSILLECTOMY AND ADENOIDECTOMY  1944    Prior to Admission medications   Medication Sig Start Date End Date Taking? Authorizing Provider  5-Hydroxytryptophan (5-HTP) 100 MG CAPS Take 100 mg by mouth 2 (two) times daily.     [provider]  ALPRAZolam Duanne Moron) 0.5 MG tablet Take 1 tablet (0.5 mg total) by mouth at bedtime as needed for sleep. Patient taking differently: Take 0.25 mg by mouth at bedtime as needed for sleep.  12/31/16   Pleas Koch, NP  aspirin 325 MG tablet Take 81 mg by mouth 2 (two) times a week.    [provider]  Azelastine HCl 0.15 % SOLN Place 093.2 application into the nose. Place 2 sprays into both nostrils nightly 02/06/16   [provider]  Bioflavonoid Products (ESTER-C) TABS Take 1 tablet by mouth daily.    [provider]  Buprenorphine HCl-Naloxone HCl 0.7-0.18 MG SUBL Place 0.5 tablets under the tongue 2 (two) times daily.    [provider]  buPROPion (WELLBUTRIN SR) 100 MG 12 hr tablet bupropion HCl SR 100 mg tablet,12 hr sustained-release    [provider]  cabergoline (DOSTINEX) 0.5 MG tablet  cabergoline 0.5 mg tablet    [provider]  Calcium Carbonate-Vit D-Min (CALCIUM 1200 PO) Take 1 tablet by mouth 2 (two) times daily.    [provider]  carbidopa-levodopa (SINEMET IR) 25-100 MG tablet carbidopa 25 mg-levodopa 100 mg tablet    [provider]  chlorpheniramine-HYDROcodone (TUSSIONEX PENNKINETIC ER) 10-8 MG/5ML SUER Take 5 mLs by mouth as needed for cough.    [provider]  cholecalciferol (VITAMIN D) 1000 units tablet  Take 1,000 Units by mouth daily.    [provider]  Cinnamon 500 MG capsule Take 2 capsules by mouth daily.     [provider]  clonazePAM (KLONOPIN) 0.5 MG tablet Take 0.5 mg by mouth as needed for anxiety.    [provider]  cloNIDine (CATAPRES) 0.1 MG tablet clonidine HCl 0.1 mg tablet    [provider]  Cyanocobalamin (B-12) 1000 MCG TABS Take 1 tablet by mouth daily. Patient taking differently: Place 1 tablet under the tongue daily.  06/24/17   Plonk, Gwyndolyn Saxon, MD  dipyridamole (PERSANTINE) 75 MG tablet dipyridamole 75 mg tablet    [provider]  docusate calcium (SURFAK) 240 MG capsule Take by mouth.    [provider]  escitalopram (LEXAPRO) 5 MG tablet escitalopram 5 mg tablet    [provider]  Estradiol (VAGIFEM VA) Place 10 mg vaginally at bedtime.    [provider]  FERROUS FUMARATE-VITAMIN C ER PO Take 65-125 mg by mouth daily.    [provider]  fluocinonide ointment (LIDEX) 0.05 % fluocinonide 0.05 % topical ointment 09/29/17   [provider]  folic acid (FOLVITE) 1 MG tablet Take 1,200 mcg by mouth daily.  10/18/07   [provider]  gabapentin (NEURONTIN) 300 MG capsule Take 300 mg by mouth 3 (three) times daily.    [provider]  Gabapentin Enacarbil (HORIZANT) 600 MG TBCR Take 600 mg by mouth daily after supper.    [provider]  Glucosamine-Chondroitin (COSAMIN DS PO) Take 1 tablet by mouth 2 (two) times daily.     [provider]  HYDROcodone-acetaminophen (NORCO/VICODIN) 5-325 MG tablet Take 1 tablet by mouth at bedtime.    [provider]  Hypertonic Nasal Wash (SINUS RINSE NA) Place 1 application into the nose daily.    [provider]  Influenza vac split quadrivalent PF (FLUZONE HIGH-DOSE) 0.5 ML injection Fluzone High-Dose 2017-2018 (PF) 180 mcg/0.5 mL intramuscular syringe  inject 0.5 milliliter intramuscularly     [provider]  ipratropium (ATROVENT HFA) 17 MCG/ACT inhaler Inhale 2 puffs into the lungs every 6 (six) hours as needed.    [provider]  ketotifen (ZADITOR) 0.025 % ophthalmic solution Apply 1 drop to eye 2 (two) times daily as needed.    [provider]  levocetirizine (XYZAL) 5 MG tablet Take 1 tablet by mouth daily with breakfast.  02/06/16 10/20/17  [provider]  MAGNESIUM GLUCONATE PO Take 120 mg by mouth daily.     [provider]  meclizine (ANTIVERT) 12.5 MG tablet Take 1 tablet by mouth 3 (three) times daily as needed.    [provider]  Melatonin 10 MG TABS Take by mouth.    [provider]  meloxicam (MOBIC) 15 MG tablet meloxicam 15 mg tablet    [provider]  methadone (DOLOPHINE) 5 MG tablet Take 2.5 mg by mouth as needed.    [provider]  montelukast (SINGULAIR) 10 MG tablet Take 1 tablet  by mouth daily. 10/14/10   [provider]  Multiple Vitamin (MULTIVITAMIN) capsule Take 1 capsule by mouth daily. 10/30/08   [provider]  naproxen (NAPROSYN) 375 MG tablet naproxen 375 mg tablet    [provider]  NONFORMULARY OR COMPOUNDED ITEM Cream with cannabis oil    [provider]  omega-3 acid ethyl esters (LOVAZA) 1 g capsule Take by mouth. 10/18/07   [provider]  ondansetron (ZOFRAN-ODT) 4 MG disintegrating tablet Take 4 mg by mouth as needed for nausea or vomiting.    [provider]  pramipexole (MIRAPEX) 0.125 MG tablet Take 0.125 mg by mouth daily.    [provider]  promethazine (PHENERGAN) 25 MG tablet promethazine 25 mg tablet    [provider]  rOPINIRole (REQUIP) 1 MG tablet ropinirole 1 mg tablet    [provider]  Rotigotine (NEUPRO) 1 MG/24HR PT24 Place onto the skin.    [provider]  simethicone (MYLICON) 009 MG chewable tablet Chew 125 mg by mouth daily.    [provider]   sodium citrate-citric acid (ORACIT) 500-334 MG/5ML solution Take 1 mL by mouth daily. 04/25/13   [provider]  traMADol Veatrice Bourbon) 50 MG tablet Take by mouth. 06/23/17   [provider]  YUVAFEM 10 MCG TABS vaginal tablet  12/01/17   [provider]  zaleplon (SONATA) 10 MG capsule zaleplon 10 mg capsule    [provider]  zolpidem (AMBIEN) 5 MG tablet zolpidem 5 mg tablet    [provider]    Allergies Bupropion, Lactose intolerance (gi), Sulfa antibiotics, and Zolpidem  Family History  Problem Relation Age of Onset  . Breast cancer Neg Hx     Social History Social History   Tobacco Use  . Smoking status: Never Smoker  . Smokeless tobacco: Never Used  Substance Use Topics  . Alcohol use: No  . Drug use: No    Review of Systems  Constitutional: Negative for fever. Eyes: Negative for visual changes. ENT: Negative for sore throat. Cardiovascular: Negative for chest pain. Respiratory: Negative for shortness of breath. Gastrointestinal: Negative for abdominal pain, vomiting and diarrhea. Genitourinary: Negative for dysuria. Musculoskeletal: Negative for back pain. Left wrist pain and deformity Skin: Negative for rash. Neurological: Negative for headaches, focal weakness or numbness. ____________________________________________  PHYSICAL EXAM:  VITAL SIGNS: ED Triage Vitals  Enc Vitals Group     BP 03/12/19 1024 127/76     Pulse Rate 03/12/19 1024 84     Resp 03/12/19 1024 14     Temp 03/12/19 1024 98.1 F (36.7 C)     Temp Source 03/12/19 1024 Oral     SpO2 03/12/19 1024 96 %     Weight 03/12/19 1025 145 lb (65.8 kg)     Height --      Head Circumference --      Peak Flow --      Pain Score 03/12/19 1024 8     Pain Loc --      Pain Edu? --      Excl. in [place]? --     Constitutional: Alert and oriented. Well appearing and in no distress. Head: Normocephalic and atraumatic. Eyes: Conjunctivae are normal. Normal  extraocular movements Neck: Supple. No thyromegaly. Cardiovascular: Normal rate, regular rhythm. Normal distal pulses and cap refill. Respiratory: Normal respiratory effort.  Musculoskeletal: Left wrist with obvious deformity. Normal composite fist bilaterally. Nontender with normal range of motion in all extremities.  Neurologic:  Normal gross sensation. Normal intrinsic and opposition testing. Normal speech and language. No gross focal neurologic deficits are appreciated. Skin:  Skin is warm, dry and intact. No rash noted. ____________________________________________   RADIOLOGY  DG Left Wrist IMPRESSION: Comminuted distal radius fracture with 40 degrees of volar Angulation.  Ulnar styloid fracture. Soft tissue swelling.   DG Left Wrist Post-Redux IMPRESSION: Redemonstration of distal radius fracture with improved volar angulation after reduction. Approximately 15 degrees measured on the lateral view. Ulnar styloid fracture. ____________________________________________  PROCEDURES  Methadone 2.5 mg PO (patient home med taken after arrival)   Reduction of fracture  Date/Time: 03/12/2019 11:14 AM Performed by: Melvenia Needles, PA-C Authorized by: Melvenia Needles, PA-C  Consent: Verbal consent obtained. Written consent not obtained. Risks and benefits: risks, benefits and alternatives were discussed Consent given by: patient Patient understanding: patient states understanding of the procedure being performed Patient consent: the patient's understanding of the procedure matches consent given Imaging studies: imaging studies available Patient identity confirmed: verbally with patient Preparation: Patient was prepped and draped in the usual sterile fashion. Local anesthesia used: yes Anesthesia: hematoma block  Anesthesia: Local anesthesia used: yes Local Anesthetic: lidocaine 1% without epinephrine Anesthetic total: 5 mL  Sedation: Patient sedated:  no  Patient tolerance: patient tolerated the procedure well with no immediate complications  .Splint Application  Date/Time: 03/12/2019 11:25 PM Performed by: Melvenia Needles, PA-C Authorized by: Melvenia Needles, PA-C   Consent:    Consent obtained:  Verbal   Consent given by:  Patient   Risks discussed:  Pain   Alternatives discussed:  No treatment Pre-procedure details:    Sensation:  Normal Procedure details:    Laterality:  Left   Location:  Wrist   Wrist:  L wrist   Splint type:  Sugar tong   Supplies:  Cotton padding, Ortho-Glass, sling and elastic bandage Post-procedure details:    Pain:  Improved   Sensation:  Normal   Patient tolerance of procedure:  Tolerated well, no immediate complications  ____________________________________________  INITIAL IMPRESSION / ASSESSMENT AND PLAN / ED COURSE  Brittany Browning was evaluated in Emergency Department on 03/12/2019 for the symptoms described in the history of present illness. She was evaluated in the context of the global COVID-19 pandemic, which necessitated consideration that the patient might be at risk for infection with the SARS-CoV-2 virus that causes COVID-19. Institutional protocols and algorithms that pertain to the evaluation of patients at risk for COVID-19 are in a state of rapid change based on information released by regulatory bodies including the CDC and federal and state organizations. These policies and algorithms were followed during the patient's care in the ED.  Patient presents to the ED for initial fracture management of left wrist fracture following a mechanical fall. She has improved alignment and pain following reduction and splinting. She will follow-up with ortho as discussed.   ----------------------------------------- 11:01 AM on 03/12/2019 ----------------------------------------- S/w Dr. Mack Guise: he is in on a hip case. Suggest reduction and splinting. He will see the patient in  the office on Monday.    I reviewed the patient's prescription history over the last 12 months in the multi-state controlled substances database(s) that includes [place], [place], [place], [place], [place], [place], [place], [place], [place], [place], [place], [place], [place], and [place].  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 ____________________________________________  FINAL CLINICAL IMPRESSION(S) / ED DIAGNOSES  Final diagnoses:  Left wrist fracture, closed, initial  encounter  Reduction defect of left upper extremity      Brittany Browning, Dannielle Karvonen, PA-C 03/12/19 [address], Dannielle Karvonen, PA-C 03/14/19 1053    Lilia Pro., MD 03/15/19 0002

## 2019-03-12 NOTE — ED Notes (Signed)
Placed sugar tong on pt.

## 2019-03-12 NOTE — Consult Note (Signed)
ORTHOPAEDIC CONSULTATION  REQUESTING PHYSICIAN: Lilia Pro., MD  Chief Complaint: Left wrist pain s/p fall  HPI:  Called by Lillia Mountain regarding this patient with a left distal radius fracture after tripping over her walking boot that she is using to treat a recent foot fracture.  Patient is reported to have fallen on an outstretched hand.  Patient is reported to be neurovascular intact with intact skin.   Past Medical History:  Diagnosis Date  . Anemia   . Anxiety   . Arthritis    osteoarthritis  . Basal cell carcinoma (BCC)   . Cataract   . Complication of anesthesia    has had problems in the past. can tolerate propofol  . Environmental allergies   . Insomnia   . Osteopenia   . Restless leg syndrome   . Stress incontinence   . Willis-Ekbom syndrome    Past Surgical History:  Procedure Laterality Date  . CATARACT EXTRACTION W/PHACO Left 10/20/2017   Procedure: CATARACT EXTRACTION PHACO AND INTRAOCULAR LENS PLACEMENT (IOC);  Surgeon: Birder Robson, MD;  Location: [facility] ORS;  Service: Ophthalmology;  Laterality: Left;  [name] 00:39.7 AP% 12.9 CDE 5.09 Fluid Pack Lot # [id number]  . COLONOSCOPY    . SKIN CANCER EXCISION    . TONSILLECTOMY AND ADENOIDECTOMY  1944   Social History   Socioeconomic History  . Marital status: Married    Spouse name: Not on file  . Number of children: Not on file  . Years of education: Not on file  . Highest education level: Not on file  Occupational History  . Not on file  Social Needs  . Financial resource strain: Not on file  . Food insecurity    Worry: Not on file    Inability: Not on file  . Transportation needs    Medical: Not on file    Non-medical: Not on file  Tobacco Use  . Smoking status: Never Smoker  . Smokeless tobacco: Never Used  Substance and Sexual Activity  . Alcohol use: No  . Drug use: No  . Sexual activity: Not on file  Lifestyle  . Physical activity    Days per week: Not on file    Minutes per  session: Not on file  . Stress: Not on file  Relationships  . Social Herbalist on phone: Not on file    Gets together: Not on file    Attends religious service: Not on file    Active member of club or organization: Not on file    Attends meetings of clubs or organizations: Not on file    Relationship status: Not on file  Other Topics Concern  . Not on file  Social History Narrative   Married.   Resides in [place].   Retired.   Family History  Problem Relation Age of Onset  . Breast cancer Neg Hx    Allergies  Allergen Reactions  . Bupropion Anxiety and Other (See Comments)    Aggitation, nausea, light headed  . Lactose Intolerance (Gi) Other (See Comments)    GI symptoms  . Sulfa Antibiotics Other (See Comments)    Reaction in the past (unknown)  . Zolpidem Other (See Comments)    depression   Prior to Admission medications   Medication Sig Start Date End Date Taking? Authorizing Provider  5-Hydroxytryptophan (5-HTP) 100 MG CAPS Take 100 mg by mouth 2 (two) times daily.     [provider]  ALPRAZolam Duanne Moron) 0.5  MG tablet Take 1 tablet (0.5 mg total) by mouth at bedtime as needed for sleep. Patient taking differently: Take 0.25 mg by mouth at bedtime as needed for sleep.  12/31/16   Pleas Koch, NP  aspirin 325 MG tablet Take 81 mg by mouth 2 (two) times a week.    [provider]  Azelastine HCl 0.15 % SOLN Place 833.8 application into the nose. Place 2 sprays into both nostrils nightly 02/06/16   [provider]  Bioflavonoid Products (ESTER-C) TABS Take 1 tablet by mouth daily.    [provider]  Buprenorphine HCl-Naloxone HCl 0.7-0.18 MG SUBL Place 0.5 tablets under the tongue 2 (two) times daily.    [provider]  buPROPion (WELLBUTRIN SR) 100 MG 12 hr tablet bupropion HCl SR 100 mg tablet,12 hr sustained-release    [provider]  cabergoline (DOSTINEX) 0.5 MG tablet cabergoline 0.5 mg tablet     [provider]  Calcium Carbonate-Vit D-Min (CALCIUM 1200 PO) Take 1 tablet by mouth 2 (two) times daily.    [provider]  carbidopa-levodopa (SINEMET IR) 25-100 MG tablet carbidopa 25 mg-levodopa 100 mg tablet    [provider]  chlorpheniramine-HYDROcodone (TUSSIONEX PENNKINETIC ER) 10-8 MG/5ML SUER Take 5 mLs by mouth as needed for cough.    [provider]  cholecalciferol (VITAMIN D) 1000 units tablet Take 1,000 Units by mouth daily.    [provider]  Cinnamon 500 MG capsule Take 2 capsules by mouth daily.     [provider]  clonazePAM (KLONOPIN) 0.5 MG tablet Take 0.5 mg by mouth as needed for anxiety.    [provider]  cloNIDine (CATAPRES) 0.1 MG tablet clonidine HCl 0.1 mg tablet    [provider]  Cyanocobalamin (B-12) 1000 MCG TABS Take 1 tablet by mouth daily. Patient taking differently: Place 1 tablet under the tongue daily.  06/24/17   Plonk, Gwyndolyn Saxon, MD  dipyridamole (PERSANTINE) 75 MG tablet dipyridamole 75 mg tablet    [provider]  docusate calcium (SURFAK) 240 MG capsule Take by mouth.    [provider]  escitalopram (LEXAPRO) 5 MG tablet escitalopram 5 mg tablet    [provider]  Estradiol (VAGIFEM VA) Place 10 mg vaginally at bedtime.    [provider]  FERROUS FUMARATE-VITAMIN C ER PO Take 65-125 mg by mouth daily.    [provider]  fluocinonide ointment (LIDEX) 0.05 % fluocinonide 0.05 % topical ointment 09/29/17   [provider]  folic acid (FOLVITE) 1 MG tablet Take 1,200 mcg by mouth daily.  10/18/07   [provider]  gabapentin (NEURONTIN) 300 MG capsule Take 300 mg by mouth 3 (three) times daily.    [provider]  Gabapentin Enacarbil (HORIZANT) 600 MG TBCR Take 600 mg by mouth daily after supper.    [provider]  Glucosamine-Chondroitin (COSAMIN DS PO) Take 1 tablet by mouth 2 (two) times  daily.     [provider]  HYDROcodone-acetaminophen (NORCO/VICODIN) 5-325 MG tablet Take 1 tablet by mouth at bedtime.    [provider]  Hypertonic Nasal Wash (SINUS RINSE NA) Place 1 application into the nose daily.    [provider]  Influenza vac split quadrivalent PF (FLUZONE HIGH-DOSE) 0.5 ML injection Fluzone High-Dose 2017-2018 (PF) 180 mcg/0.5 mL intramuscular syringe  inject 0.5 milliliter intramuscularly    [provider]  ipratropium (ATROVENT HFA) 17 MCG/ACT inhaler Inhale 2 puffs into the lungs every 6 (  six) hours as needed.    [provider]  ketotifen (ZADITOR) 0.025 % ophthalmic solution Apply 1 drop to eye 2 (two) times daily as needed.    [provider]  levocetirizine (XYZAL) 5 MG tablet Take 1 tablet by mouth daily with breakfast.  02/06/16 10/20/17  [provider]  MAGNESIUM GLUCONATE PO Take 120 mg by mouth daily.     [provider]  meclizine (ANTIVERT) 12.5 MG tablet Take 1 tablet by mouth 3 (three) times daily as needed.    [provider]  Melatonin 10 MG TABS Take by mouth.    [provider]  meloxicam (MOBIC) 15 MG tablet meloxicam 15 mg tablet    [provider]  methadone (DOLOPHINE) 5 MG tablet Take 2.5 mg by mouth as needed.    [provider]  montelukast (SINGULAIR) 10 MG tablet Take 1 tablet by mouth daily. 10/14/10   [provider]  Multiple Vitamin (MULTIVITAMIN) capsule Take 1 capsule by mouth daily. 10/30/08   [provider]  naproxen (NAPROSYN) 375 MG tablet naproxen 375 mg tablet    [provider]  NONFORMULARY OR COMPOUNDED ITEM Cream with cannabis oil    [provider]  omega-3 acid ethyl esters (LOVAZA) 1 g capsule Take by mouth. 10/18/07   [provider]  ondansetron (ZOFRAN-ODT) 4 MG disintegrating tablet Take 4 mg by mouth as needed for nausea or vomiting.    [provider]   pramipexole (MIRAPEX) 0.125 MG tablet Take 0.125 mg by mouth daily.    [provider]  promethazine (PHENERGAN) 25 MG tablet promethazine 25 mg tablet    [provider]  rOPINIRole (REQUIP) 1 MG tablet ropinirole 1 mg tablet    [provider]  Rotigotine (NEUPRO) 1 MG/24HR PT24 Place onto the skin.    [provider]  simethicone (MYLICON) 951 MG chewable tablet Chew 125 mg by mouth daily.    [provider]  sodium citrate-citric acid (ORACIT) 500-334 MG/5ML solution Take 1 mL by mouth daily. 04/25/13   [provider]  traMADol Veatrice Bourbon) 50 MG tablet Take by mouth. 06/23/17   [provider]  YUVAFEM 10 MCG TABS vaginal tablet  12/01/17   [provider]  zaleplon (SONATA) 10 MG capsule zaleplon 10 mg capsule    [provider]  zolpidem (AMBIEN) 5 MG tablet zolpidem 5 mg tablet    [provider]   Dg Wrist Complete Left  Result Date: 03/12/2019 CLINICAL DATA:  78 year old female with a fall and wrist pain EXAM: LEFT WRIST - COMPLETE 3+ VIEW COMPARISON:  None. FINDINGS: Osteopenia. Comminuted fracture of the distal left radius. There is approximately 40 degrees of volar angulation. Soft tissue swelling. Ulnar styloid fracture. IMPRESSION: Comminuted distal radius fracture with 40 degrees of volar angulation. Ulnar styloid fracture. Soft tissue swelling. Electronically Signed   By: Corrie Mckusick D.O.   On: 03/12/2019 11:07      Assessment: Closed left distal radius fracture with dorsal angulation  Plan: I reviewed the x-rays of the left wrist taken in the emergency department.  Patient has approximately 30 to 40 degrees of dorsal angulation.  There is loss of radial height as well.  Since I am about to start surgery to fix a femur fracture I am unavailable at this time to perform a closed reduction in the ER.  I told Ms.  Swarthmore Lions that if one of the ER providers feels comfortable close reducing the  fracture,  they may certainly do so, if not the patient may be splinted in place and can follow up in our office on Monday for close reduction or to discuss surgery.   [place], MD    03/12/2019 11:47 AM

## 2019-03-12 NOTE — ED Notes (Signed)
Pt states that she took methadone while in the room for her pain and didn't want to tell anyone

## 2019-03-14 ENCOUNTER — Ambulatory Visit (INDEPENDENT_AMBULATORY_CARE_PROVIDER_SITE_OTHER): Payer: Medicare Other | Admitting: Psychology

## 2019-03-14 DIAGNOSIS — F418 Other specified anxiety disorders: Secondary | ICD-10-CM

## 2019-03-14 DIAGNOSIS — S92353A Displaced fracture of fifth metatarsal bone, unspecified foot, initial encounter for closed fracture: Secondary | ICD-10-CM | POA: Insufficient documentation

## 2019-03-14 DIAGNOSIS — S52539A Colles' fracture of unspecified radius, initial encounter for closed fracture: Secondary | ICD-10-CM | POA: Insufficient documentation

## 2019-03-25 ENCOUNTER — Ambulatory Visit (INDEPENDENT_AMBULATORY_CARE_PROVIDER_SITE_OTHER): Payer: Medicare Other | Admitting: Psychology

## 2019-03-25 DIAGNOSIS — F418 Other specified anxiety disorders: Secondary | ICD-10-CM

## 2019-04-12 ENCOUNTER — Ambulatory Visit (INDEPENDENT_AMBULATORY_CARE_PROVIDER_SITE_OTHER): Payer: Medicare Other | Admitting: Psychology

## 2019-04-12 ENCOUNTER — Ambulatory Visit: Payer: Medicare Other | Admitting: Psychology

## 2019-04-12 DIAGNOSIS — F418 Other specified anxiety disorders: Secondary | ICD-10-CM

## 2019-04-28 ENCOUNTER — Ambulatory Visit (INDEPENDENT_AMBULATORY_CARE_PROVIDER_SITE_OTHER): Payer: Medicare Other | Admitting: Psychology

## 2019-04-28 DIAGNOSIS — F418 Other specified anxiety disorders: Secondary | ICD-10-CM

## 2019-05-16 ENCOUNTER — Ambulatory Visit (INDEPENDENT_AMBULATORY_CARE_PROVIDER_SITE_OTHER): Payer: Medicare Other | Admitting: Psychology

## 2019-05-16 DIAGNOSIS — F418 Other specified anxiety disorders: Secondary | ICD-10-CM | POA: Diagnosis not present

## 2019-05-31 ENCOUNTER — Ambulatory Visit: Payer: Medicare Other | Admitting: Psychology

## 2019-06-09 ENCOUNTER — Ambulatory Visit (INDEPENDENT_AMBULATORY_CARE_PROVIDER_SITE_OTHER): Payer: Medicare Other | Admitting: Psychology

## 2019-06-09 DIAGNOSIS — F418 Other specified anxiety disorders: Secondary | ICD-10-CM

## 2019-06-27 ENCOUNTER — Other Ambulatory Visit: Payer: Self-pay | Admitting: Obstetrics and Gynecology

## 2019-06-27 DIAGNOSIS — M8580 Other specified disorders of bone density and structure, unspecified site: Secondary | ICD-10-CM

## 2019-06-27 DIAGNOSIS — Z1231 Encounter for screening mammogram for malignant neoplasm of breast: Secondary | ICD-10-CM

## 2019-06-28 ENCOUNTER — Ambulatory Visit: Payer: Medicare Other | Admitting: Psychology

## 2019-07-04 DIAGNOSIS — I491 Atrial premature depolarization: Secondary | ICD-10-CM | POA: Insufficient documentation

## 2019-07-11 ENCOUNTER — Ambulatory Visit (INDEPENDENT_AMBULATORY_CARE_PROVIDER_SITE_OTHER): Payer: Medicare Other | Admitting: Psychology

## 2019-07-11 DIAGNOSIS — F418 Other specified anxiety disorders: Secondary | ICD-10-CM

## 2019-07-26 ENCOUNTER — Ambulatory Visit (INDEPENDENT_AMBULATORY_CARE_PROVIDER_SITE_OTHER): Payer: Medicare Other | Admitting: Psychology

## 2019-07-26 DIAGNOSIS — F418 Other specified anxiety disorders: Secondary | ICD-10-CM

## 2019-08-19 ENCOUNTER — Ambulatory Visit: Payer: Medicare Other | Admitting: Psychology

## 2019-09-13 DIAGNOSIS — J849 Interstitial pulmonary disease, unspecified: Secondary | ICD-10-CM | POA: Insufficient documentation

## 2019-11-16 DIAGNOSIS — F411 Generalized anxiety disorder: Secondary | ICD-10-CM | POA: Insufficient documentation

## 2019-11-21 DIAGNOSIS — S43401A Unspecified sprain of right shoulder joint, initial encounter: Secondary | ICD-10-CM | POA: Insufficient documentation

## 2019-11-21 DIAGNOSIS — N3941 Urge incontinence: Secondary | ICD-10-CM | POA: Insufficient documentation

## 2019-12-27 ENCOUNTER — Ambulatory Visit: Payer: Medicare Other

## 2020-01-03 ENCOUNTER — Other Ambulatory Visit: Payer: Self-pay

## 2020-01-03 ENCOUNTER — Ambulatory Visit: Payer: Medicare Other | Attending: Family Medicine

## 2020-01-03 DIAGNOSIS — M62838 Other muscle spasm: Secondary | ICD-10-CM

## 2020-01-03 DIAGNOSIS — R262 Difficulty in walking, not elsewhere classified: Secondary | ICD-10-CM

## 2020-01-03 DIAGNOSIS — M4125 Other idiopathic scoliosis, thoracolumbar region: Secondary | ICD-10-CM | POA: Diagnosis not present

## 2020-01-03 DIAGNOSIS — R42 Dizziness and giddiness: Secondary | ICD-10-CM | POA: Diagnosis present

## 2020-01-03 NOTE — Therapy (Signed)
[place] MAIN [facility] SERVICES [address] [place], [place], [address] Phone: [phone]   Fax:  [phone]  Physical Therapy Evaluation  The patient has been informed of current processes in place at Outpatient Rehab to protect patients from Covid-19 exposure including social distancing, schedule modifications, and new cleaning procedures. After discussing their particular risk with a therapist based on the patient's personal risk factors, the patient has decided to proceed with in-person therapy.   Patient Details  Name: Brittany Browning MRN: [id number] Date of Birth: [birth year] No data recorded  Encounter Date: 01/03/2020  PT End of Session - 01/03/20 1156    Visit Number  1    Number of Visits  20    Date for PT Re-Evaluation  05/22/20    Authorization Type  medicare/BCBS    Authorization Time Period  01/03/20 through 05/22/20    Authorization - Visit Number  1    Authorization - Number of Visits  20    Progress Note Due on Visit  10    PT Start Time  1030    PT Stop Time  1145    PT Time Calculation (min)  75 min    Activity Tolerance  Patient tolerated treatment well;No increased pain    Behavior During Therapy  [facility] for tasks assessed/performed;Anxious       Past Medical History:  Diagnosis Date  . Anemia   . Anxiety   . Arthritis    osteoarthritis  . Basal cell carcinoma (BCC)   . Cataract   . Complication of anesthesia    has had problems in the past. can tolerate propofol  . Environmental allergies   . Insomnia   . Osteopenia   . Restless leg syndrome   . Stress incontinence   . Willis-Ekbom syndrome     Past Surgical History:  Procedure Laterality Date  . CATARACT EXTRACTION W/PHACO Left 10/20/2017   Procedure: CATARACT EXTRACTION PHACO AND INTRAOCULAR LENS PLACEMENT (IOC);  Surgeon: Birder Robson, MD;  Location: [facility] ORS;  Service: Ophthalmology;  Laterality: Left;  [name] 00:39.7 AP% 12.9 CDE 5.09 Fluid Pack  Lot # [id number]  . COLONOSCOPY    . SKIN CANCER EXCISION    . TONSILLECTOMY AND ADENOIDECTOMY  1944    There were no vitals filed for this visit.  Pelvic Floor Physical Therapy Evaluation and Assessment  SCREENING  Falls in last 6 mo: slid off the love-seat and hit her forearms, fell last in Aug. 2020     Patient's communication preference:   Red Flags:  Have you had any night sweats? yes Unexplained weight loss? none Saddle anesthesia? none Unexplained changes in bowel or bladder habits? none  SUBJECTIVE  Patient reports: She did not sleep a wink last night, she is still having very high stress levels. Her dizziness is persisting and she has an ENT appointment coming up to help her figure out what is going on.  She has tried biofeedback and felt like it helped a little bit but not as much as she would have liked.  Has been told she is "a little off kilter" that one leg is 1/2 inch higher than the other Had to be in a boot for a couple months on her L side after she fell and broke her L arm and some foot pain.   Had a hypersensitive bladder as early as 1989. Seems to be worse if high heat/humidity. Was confirmed not to have prolapse. Has seen some improvement with hormonal  creams. Had vulvodynia occasionally, seems to do be better if she avoids acidic foods.   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  Recent Procedures/Tests/Findings:  1. Multilevel cervical spine degenerative disc and facet changes. No  significant central canal stenosis in the lumbar spine.  2. Multilevel neural foraminal stenosis. There is moderate right-sided  neural foraminal stenosis at L2-L3 and moderate left-sided neural foraminal  stenosis at L5-S1. Laterally bulging disc osteophyte  complex at L5-S1 abuts  the left L5 nerve root as it exits the left neural foramen. From note of Dr Olive Bass 07/06/19  Obstetrical History: No children  Gynecological History: none  Urinary History: Started having leakage in 1989, has leakage mostly with activity and with cough/sneeze.  Nocturiax 2-3,  urinates ~ every 2-3 hours throughout the day.  Drinks "a lot of water" throughout the day  Maybe not quite 64 oz and sometimes flavored waters, etc.  Gastrointestinal History: Drinks prune juice and takes magnesium to stay regular, would be constipated otherwise from the opioid she takes for her restless leg syndrome.  Sexual activity/pain: None, but it has been a while. 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  Location of pain: L mid foot Current pain:  0/10  Max pain:  4/10 Least pain:  0/10 Nature of pain: walking, "just hurt"  Patient Goals: Decrease leakage, improve balance.   OBJECTIVE  Posture/Observations:  Sitting: leaned back, legs crossed, arms crossed Standing: Right shoulder low, left ilium high, L ASIS high, weight forward on toes, flat-back posture, L PSIS low R PSIS high  Palpation/Segmental Motion/Joint Play: Deferred to follow-up  Special tests:   Scoliosis: R lumbar/L thoracic scoliosis Supine-to-long-sit: LLE long in both. Leg-length: even but pelvis not level  Range of Motion/Flexibilty:  Spine: SB to knee B with twinge in L knee during L bend, ~ 75% reduced in rot B Forward bend ~ 18 inches from floor.  Hips:   Strength/MMT: Deferred to follow-up LE MMT  LE MMT Left Right  Hip flex:  (L2) /5 /5  Hip ext: /5 /5  Hip abd: /5 /5  Hip add: /5 /5  Hip IR /5 /5  Hip ER /5 /5     Abdominal:  Palpation: TTP to R>L diaphragm, hip-flexors, adductors. Diastasis: none  Pelvic Floor External Exam: Deferred to follow-up Introitus Appears:  Skin integrity:  Palpation: Cough: Prolapse visible?: Scar mobility:  Internal Vaginal  Exam: Strength (PERF):  Symmetry: Palpation: Prolapse:   Internal Rectal Exam: Strength (PERF): Symmetry: Palpation: Prolapse:   Gait Analysis: wide BOS, listing to either side and uses furniture/walls to steady herself occasionally.   Pelvic Floor Outcome Measures:FOTO PFDI Urinary: 17, Urinary Problem: 41  INTERVENTIONS THIS SESSION: Self-care Educated on the structure and function of the pelvic floor in relation to their symptoms as well as the POC, and initial HEP in order to set patient expectations and understanding from which we will build on in the future sessions. Educated on potential to use dry-needling to speed her recovery (Pt. Receptive), educated on potential to use a topical magnesium to increase absorption without irritating bowels. Discussed plan to focus on dizziness first and then get pelvis levelled and start strengthening.   Total time: 75 min.  Objective measurements completed on examination: See above findings.                PT Short Term Goals - 01/03/20 1214      PT SHORT TERM GOAL #1   Title  Patient will demonstrate improved pelvic alignment and balance of musculature surrounding the pelvis to facilitate decreased PFM spasms and decrease pelvic pain.    Baseline  R up-slip and L anterior rotation    Time  10    Period  Weeks    Status  New    Target Date  03/13/20      PT SHORT TERM GOAL #2   Title  Pt. will describe resolution of dizziness with positional changes to decrease risk of fall and increase ability to participate in HEP safely.    Baseline  Pt. describes dizziness with turning her head and positional changes.    Time  5    Period  Weeks    Status  New    Target Date  02/07/20      PT SHORT TERM GOAL #3   Title  Patient will demonstrate HEP x1 in the clinic to demonstrate understanding and proper form to allow for further improvement.    Baseline  Pt. lacks knowledge of therapeutic  exercises that will help decrease her SUI and increase her balance.    Time  5    Period  Weeks    Status  New    Target Date  02/07/20      PT SHORT TERM GOAL #4   Title  Pt. will be able to walk to and from waiting room without using furniture or walls for support to demonstrate increased confindence in her balance.    Baseline  Pt. not using an appropriate AD, participating in furniture walking due to decreased confidence in her balance    Time  5    Period  Weeks    Status  New    Target Date  02/07/20      PT SHORT TERM GOAL #5   Title  Patient will demonstrate a coordinated contraction, relaxation, and bulge of the pelvic floor muscles to demonstrate functional recruitment and motion and allow for further strengthening.    Baseline  Pt. has breathing dysfunction and SUI indicating poor PFM coordination    Time  10    Period  Weeks    Status  New    Target Date  03/13/20        PT Long Term Goals - 01/03/20 1256      PT LONG TERM GOAL #1   Title  Patient will report no episodes of SUI over the course of the prior two weeks to demonstrate improved functional ability.    Baseline  SUI with activity and cough/sneeze/etc.    Time  20    Period  Weeks    Status  New    Target Date  05/22/20      PT LONG TERM GOAL #2   Title  Patient will demonstrate 4+/5 strength or better in the PF and surrounding musculature to show improved functional strength for improved balance with exercise and other vocational and recreational activities.    Baseline  Pt. describes feeling of weakness and demonstrates decreased balance, MMT to be established at follow-up    Time  20    Period  Weeks    Status  New    Target Date  05/22/20      PT  LONG TERM GOAL #3   Title  Pt. will improve in FOTO score by 10 points to demonstrate improved function.    Baseline  FOTO PFDI Urinary: 17, Urinary Problem: 41    Time  10    Period  Weeks    Status  New    Target Date  03/13/20              Plan - 01/03/20 1158    Clinical Impression Statement  Pt. is a 79 y/o female who presents today with cheif c/o SUI and dizziness and lack of balance. Her PMH is significant for restless leg syndrome, sciliosis, anxiety, osteopenia, insomnia and a fall in August 2020 resulting in a L wrist fracture and L foot in a boot for months. Her clinical exam revealed a R up-slip and L anterior rotation as well as R lumbar/L thoracic scoliosis, spasms surrounding the pelvis and decreased ROM in the back and hips as well as unsteadiness on feet and anterior weight shift. Pt. history suggests possible BPPV or other inner-ear issue which she will have assessed by ENT next week and she will benefit from further assessment by a vestibular PT if appropriate to decrease dizziness as we work on improving pelvic alignment and deep-core coordination to improve balance issues from a strength perspective simmultaneously. Her history strongly suggests she has more coordination and length-tension relationship causes of SUI rather than true "weakness" and she will benefit from an inter-regional interdependence framework as well as emphasis on decreaseing stress/anxiety and downregulating the nervous system to see maximal function. She will benefit from skilled pelvic health PT to address the noted deficits and continue to assess for and address other potential causes of Sx.    Personal Factors and Comorbidities  Comorbidity 3+    Comorbidities  restless leg syndrome, sciliosis, anxiety, osteopenia, insomnia and a fall in August 2020 resulting in a L wrist fracture and L foot in a boot for months    Examination-Activity Limitations  Continence;Sleep;Locomotion Level;Bend;Transfers    Examination-Participation Restrictions  Community Activity    Stability/Clinical Decision Making  Unstable/Unpredictable    Clinical Decision Making  High    Rehab Potential  Good    PT Frequency  1x / week    PT Duration  Other  (comment)   20 weeks   PT Treatment/Interventions  ADLs/Self Care Home Management;Biofeedback;Moist Heat;Electrical Stimulation;Traction;Ultrasound;Therapeutic activities;Functional mobility training;DME Instruction;Stair training;Gait training;Therapeutic exercise;Balance training;Neuromuscular re-education;Patient/family education;Orthotic Fit/Training;Manual techniques;Taping;Dry needling;Passive range of motion;Vestibular;Spinal Manipulations;Joint Manipulations    PT Next Visit Plan  address R up-slip and L anterior rotation, give hip-flexor stretch, bow-and-arrow and side-stretch    Consulted and Agree with Plan of Care  Patient       Patient will benefit from skilled therapeutic intervention in order to improve the following deficits and impairments:  Abnormal gait, Decreased balance, Decreased endurance, Difficulty walking, Increased muscle spasms, Decreased range of motion, Dizziness, Improper body mechanics, Impaired tone, Decreased coordination, Decreased strength, Increased fascial restricitons, Impaired flexibility, Postural dysfunction, Pain  Visit Diagnosis: Other idiopathic scoliosis, thoracolumbar region  Other muscle spasm  Difficulty in walking, not elsewhere classified  Dizziness and giddiness     Problem List Patient Active Problem List   Diagnosis Date Noted  . Venous reflux 08/05/2017  . Obstructive sleep apnea 07/17/2017  . B12 deficiency 07/16/2017  . Polypharmacy 07/16/2017  . Depression 07/15/2017  . Allergic rhinitis 06/24/2017  . Anxiety 06/24/2017  . Osteoarthritis 06/23/2017  . Atypical chest pain 06/23/2017  . Chronic back  pain 06/23/2017  . Female stress incontinence 06/23/2017  . Hypercholesterolemia 06/23/2017  . Osteoporosis 06/23/2017  . Situational stress 06/23/2017  . Vitamin D deficiency 06/23/2017  . Gait instability 06/23/2017  . RLS (restless legs syndrome) 12/31/2016  . Insomnia 12/31/2016  . Urinary incontinence 12/31/2016  .  Arthralgia 12/31/2016  . DNAR (do not attempt resuscitation) 05/30/2014  . Cataract 06/18/2012  . Dizziness 03/02/2012  . Referred otalgia 03/02/2012   Willa Rough DPT, ATC Willa Rough 01/03/2020, 4:44 PM  [place] MAIN [facility] SERVICES [address] [place], [place], [address] Phone: [phone]   Fax:  [phone]  Name: Scotti Kiddoo MRN: [id number] Date of Birth: 1941/07/01

## 2020-01-10 ENCOUNTER — Ambulatory Visit: Payer: Medicare Other

## 2020-01-10 ENCOUNTER — Other Ambulatory Visit: Payer: Self-pay

## 2020-01-10 DIAGNOSIS — M62838 Other muscle spasm: Secondary | ICD-10-CM

## 2020-01-10 DIAGNOSIS — M4125 Other idiopathic scoliosis, thoracolumbar region: Secondary | ICD-10-CM

## 2020-01-10 DIAGNOSIS — R42 Dizziness and giddiness: Secondary | ICD-10-CM

## 2020-01-10 DIAGNOSIS — R262 Difficulty in walking, not elsewhere classified: Secondary | ICD-10-CM

## 2020-01-10 NOTE — Therapy (Signed)
[place] MAIN [facility] SERVICES [address] [place], [place], [address] Phone: [phone]   Fax:  [phone]  Physical Therapy Treatment  The patient has been informed of current processes in place at Outpatient Rehab to protect patients from Covid-19 exposure including social distancing, schedule modifications, and new cleaning procedures. After discussing their particular risk with a therapist based on the patient's personal risk factors, the patient has decided to proceed with in-person therapy.  Patient Details  Name: Brittany Browning MRN: [id number] Date of Birth: 1941/03/25 No data recorded  Encounter Date: 01/10/2020  PT End of Session - 01/10/20 1146    Visit Number  2    Number of Visits  20    Date for PT Re-Evaluation  05/22/20    Authorization Type  medicare/BCBS    Authorization Time Period  01/03/20 through 05/22/20    Authorization - Visit Number  2    Authorization - Number of Visits  20    Progress Note Due on Visit  10    PT Start Time  1030    PT Stop Time  1135    PT Time Calculation (min)  65 min    Activity Tolerance  Patient tolerated treatment well;No increased pain    Behavior During Therapy  [facility] for tasks assessed/performed;Anxious       Past Medical History:  Diagnosis Date   Anemia    Anxiety    Arthritis    osteoarthritis   Basal cell carcinoma (BCC)    Cataract    Complication of anesthesia    has had problems in the past. can tolerate propofol   Environmental allergies    Insomnia    Osteopenia    Restless leg syndrome    Stress incontinence    Willis-Ekbom syndrome     Past Surgical History:  Procedure Laterality Date   CATARACT EXTRACTION W/PHACO Left 10/20/2017   Procedure: CATARACT EXTRACTION PHACO AND INTRAOCULAR LENS PLACEMENT (Impact);  Surgeon: Birder Robson, MD;  Location: [facility] ORS;  Service: Ophthalmology;  Laterality: Left;  [name] 00:39.7 AP% 12.9 CDE 5.09 Fluid Pack Lot #  [id number]   COLONOSCOPY     SKIN CANCER EXCISION     TONSILLECTOMY AND ADENOIDECTOMY  1944    There were no vitals filed for this visit.    Pelvic Floor Physical Therapy Treatment Note  SCREENING  Changes in medications, allergies, or medical history?: started nasocort    SUBJECTIVE  Patient reports: Is still not sleeping well, stressed about her husband. Has started doing compression therapy for her restless legs. No change in her Sx.  Precautions:  restless leg syndrome, scoliosis, anxiety, osteopenia, insomnia and a fall in August 2020 resulting in a L wrist fracture and L foot in a boot for months  Sexual activity/pain: None, but it has been a while. 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  Location of pain: L mid foot (sciatica on L) Current pain: 0/10 (0/10) Max pain: 4/10 (6/10) Least pain: 0/10 (0/10) Nature of pain:walking, "just hurt" (sharp)  Patient Goals: Decrease leakage, improve balance.   OBJECTIVE  Changes in: Posture/Observations:  R up-slip, L anterior rotation R lumbar/L thoracic scoliosis  Range of Motion/Flexibilty:  Decreased hip EXT B  Strength/MMT:  LE MMT:  Pelvic floor:  Abdominal:  Pt. Demonstrates resistance with diaphragmatic breath pre-treatment and increased ease post-treatment.  Palpation: TTP to R iliacus and B diaphragm   Gait Analysis:  INTERVENTIONS THIS SESSION: Manual: Performed TP release and STM  to R iliacus and B diaphragm to decrease spasm and pain and allow for improved balance of musculature for improved function and decreased symptoms.  Therex: educated on and practiced diaphragmatic breathing and hip-flexor stretch with hold/relax to decrease tension on lumbosacral nerves and allow for improved pelvic alignment and decreased pain/restlessness.  Self-care: Educated on how diaphragmatic breathing affects the nervous system and why it is necessary to help decrease her Sx.  Total time: 65  min.                     Trigger Point Dry Needling - 01/10/20 0001    Consent Given?  Yes    Education Handout Provided  No    Muscles Treated Back/Hip  Iliacus    Iliacus Response  Twitch response elicited;Palpable increased muscle length             PT Short Term Goals - 01/03/20 1214      PT SHORT TERM GOAL #1   Title  Patient will demonstrate improved pelvic alignment and balance of musculature surrounding the pelvis to facilitate decreased PFM spasms and decrease pelvic pain.    Baseline  R up-slip and L anterior rotation    Time  10    Period  Weeks    Status  New    Target Date  03/13/20      PT SHORT TERM GOAL #2   Title  Pt. will describe resolution of dizziness with positional changes to decrease risk of fall and increase ability to participate in HEP safely.    Baseline  Pt. describes dizziness with turning her head and positional changes.    Time  5    Period  Weeks    Status  New    Target Date  02/07/20      PT SHORT TERM GOAL #3   Title  Patient will demonstrate HEP x1 in the clinic to demonstrate understanding and proper form to allow for further improvement.    Baseline  Pt. lacks knowledge of therapeutic exercises that will help decrease her SUI and increase her balance.    Time  5    Period  Weeks    Status  New    Target Date  02/07/20      PT SHORT TERM GOAL #4   Title  Pt. will be able to walk to and from waiting room without using furniture or walls for support to demonstrate increased confindence in her balance.    Baseline  Pt. not using an appropriate AD, participating in furniture walking due to decreased confidence in her balance    Time  5    Period  Weeks    Status  New    Target Date  02/07/20      PT SHORT TERM GOAL #5   Title  Patient will demonstrate a coordinated contraction, relaxation, and bulge of the pelvic floor muscles to demonstrate functional recruitment and motion and allow for further strengthening.     Baseline  Pt. has breathing dysfunction and SUI indicating poor PFM coordination    Time  10    Period  Weeks    Status  New    Target Date  03/13/20        PT Long Term Goals - 01/03/20 1256      PT LONG TERM GOAL #1   Title  Patient will report no episodes of SUI over the course of the prior two weeks to demonstrate improved functional  ability.    Baseline  SUI with activity and cough/sneeze/etc. using 4 incontinence pads per day.    Time  20    Period  Weeks    Status  New    Target Date  05/22/20      PT LONG TERM GOAL #2   Title  Patient will demonstrate 4+/5 strength or better in the PF and surrounding musculature to show improved functional strength for improved balance with exercise and other vocational and recreational activities.    Baseline  Pt. describes feeling of weakness and demonstrates decreased balance, MMT to be established at follow-up    Time  20    Period  Weeks    Status  New    Target Date  05/22/20      PT LONG TERM GOAL #3   Title  Pt. will improve in FOTO score by 10 points to demonstrate improved function.    Baseline  FOTO PFDI Urinary: 17, Urinary Problem: 41    Time  10    Period  Weeks    Status  New    Target Date  03/13/20      PT LONG TERM GOAL #4   Title  Pt. will be able to participate in light to moderate level exercise activity without increased pain or leakage to allow for long-term improved mental and physical well-being.    Baseline  Pt. is frustrated that she cannot do her yoga classes due to her Sx.    Time  20    Period  Weeks    Status  New    Target Date  05/22/20            Plan - 01/10/20 1146    Clinical Impression Statement  Pt. Responded well to all interventions today, demonstrating decreased spasm and TTP, improved ease of breathing, as well as understanding and correct performance of all education and exercises provided today. They will continue to benefit from skilled physical therapy to work toward  remaining goals and maximize function as well as decrease likelihood of symptom increase or recurrence.    PT Next Visit Plan  TPDN to R QL, L Iliacus, address R up-slip and L anterior rotation, bow-and-arrow and side-stretch    PT Home Exercise Plan  Supine hip-flexor release with hold-relax lengthening. Diaphragmatic breathing.    Consulted and Agree with Plan of Care  Patient       Patient will benefit from skilled therapeutic intervention in order to improve the following deficits and impairments:     Visit Diagnosis: Other idiopathic scoliosis, thoracolumbar region  Other muscle spasm  Difficulty in walking, not elsewhere classified  Dizziness and giddiness     Problem List Patient Active Problem List   Diagnosis Date Noted   Venous reflux 08/05/2017   Obstructive sleep apnea 07/17/2017   B12 deficiency 07/16/2017   Polypharmacy 07/16/2017   Depression 07/15/2017   Allergic rhinitis 06/24/2017   Anxiety 06/24/2017   Osteoarthritis 06/23/2017   Atypical chest pain 06/23/2017   Chronic back pain 06/23/2017   Female stress incontinence 06/23/2017   Hypercholesterolemia 06/23/2017   Osteoporosis 06/23/2017   Situational stress 06/23/2017   Vitamin D deficiency 06/23/2017   Gait instability 06/23/2017   RLS (restless legs syndrome) 12/31/2016   Insomnia 12/31/2016   Urinary incontinence 12/31/2016   Arthralgia 12/31/2016   DNAR (do not attempt resuscitation) 05/30/2014   Cataract 06/18/2012   Dizziness 03/02/2012   Referred otalgia 03/02/2012   Willa Rough DPT,  ATC Willa Rough 01/10/2020, 11:52 AM  [place] MAIN [facility] SERVICES [address] [place], [place], [address] Phone: [phone]   Fax:  [phone]  Name: Luanna Weesner MRN: [id number] Date of Birth: 1941-05-09

## 2020-01-10 NOTE — Patient Instructions (Signed)
° ° ° °  Bring both knees up to your chest and then hold the one farthest from the edge of the table/bed and let the other relax toward the floor until stretch is felt through the front of the hip.   Hold-relax: Gently lift the knee of the down leg up ~ 1/2 and inch and hold for 5 seconds, then let it relax all the way for a second and repeat 4 more times to decrease resting tension in the muscle.   Stretch: Let the leg relax and feel a stretch across the front of the hip/thigh as you take belly breaths. Hold for __5__ deep belly breaths. Relax. Repeat __2-3__ times per side.    Do this __1-2__ times per day.

## 2020-01-12 ENCOUNTER — Other Ambulatory Visit: Payer: Self-pay | Admitting: Otolaryngology

## 2020-01-12 DIAGNOSIS — H9312 Tinnitus, left ear: Secondary | ICD-10-CM

## 2020-01-12 DIAGNOSIS — R42 Dizziness and giddiness: Secondary | ICD-10-CM

## 2020-01-17 ENCOUNTER — Ambulatory Visit: Payer: Medicare Other

## 2020-01-17 ENCOUNTER — Other Ambulatory Visit: Payer: Self-pay

## 2020-01-17 DIAGNOSIS — R42 Dizziness and giddiness: Secondary | ICD-10-CM

## 2020-01-17 DIAGNOSIS — M4125 Other idiopathic scoliosis, thoracolumbar region: Secondary | ICD-10-CM | POA: Diagnosis not present

## 2020-01-17 DIAGNOSIS — R262 Difficulty in walking, not elsewhere classified: Secondary | ICD-10-CM

## 2020-01-17 DIAGNOSIS — M62838 Other muscle spasm: Secondary | ICD-10-CM

## 2020-01-17 NOTE — Therapy (Signed)
[place] MAIN [facility] SERVICES [address] [place], [place], [address] Phone: [phone]   Fax:  [phone]  Physical Therapy Treatment  The patient has been informed of current processes in place at [facility]patient [facility] to protect patients from Covid-19 exposure including social distancing, schedule modifications, and new cleaning procedures. After discussing their particular risk with a therapist based on the patient's personal risk factors, the patient has decided to proceed with in-person therapy.   Patient Details  Name: Brittany Browning MRN: [id number] Date of Birth: 1941-06-19 No data recorded  Encounter Date: 01/17/2020   PT End of Session - 01/17/20 1605    Visit Number 3    Number of Visits 20    Date for PT Re-Evaluation 05/22/20    Authorization Type medicare/BCBS    Authorization Time Period 01/03/20 through 05/22/20    Authorization - Visit Number 3    Authorization - Number of Visits 20    Progress Note Due on Visit 10    PT Start Time 1030    PT Stop Time 1140    PT Time Calculation (min) 70 min    Activity Tolerance Patient tolerated treatment well;No increased pain    Behavior During Therapy [facility] for tasks assessed/performed;Anxious           Past Medical History:  Diagnosis Date  . Anemia   . Anxiety   . Arthritis    osteoarthritis  . Basal cell carcinoma (BCC)   . Cataract   . Complication of anesthesia    has had problems in the past. can tolerate propofol  . Environmental allergies   . Insomnia   . Osteopenia   . Restless leg syndrome   . Stress incontinence   . Willis-Ekbom syndrome     Past Surgical History:  Procedure Laterality Date  . CATARACT EXTRACTION W/PHACO Left 10/20/2017   Procedure: CATARACT EXTRACTION PHACO AND INTRAOCULAR LENS PLACEMENT (IOC);  Surgeon: Birder Robson, MD;  Location: [facility] ORS;  Service: Ophthalmology;  Laterality: Left;  [name] 00:39.7 AP% 12.9 CDE 5.09 Fluid Pack Lot #  [id number]  . COLONOSCOPY    . SKIN CANCER EXCISION    . TONSILLECTOMY AND ADENOIDECTOMY  1944    There were no vitals filed for this visit.    Pelvic Floor Physical Therapy Treatment Note  SCREENING  Changes in medications, allergies, or medical history?: started nasocort    SUBJECTIVE  Patient reports: Has been doing the stretch, is glad she has a rail on her bed, it helps keep her on the bed. Not sure that she got as much of a stretch. Had a little dizziness later in the day after last session, some soreness in the hip and on her R heel. Is worried about falling during yoga at [facility] but does feel that the yoga is helpful.   Precautions:  restless leg syndrome, scoliosis, anxiety, osteopenia, insomnia and a fall in August 2020 resulting in a L wrist fracture and L foot in a boot for months  Sexual activity/pain: None, but it has been a while. 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  Location of pain: L mid foot (sciatica on L) Current pain: 0/10 (0/10) Max pain: 4/10 (6/10) Least pain: 0/10 (0/10) Nature of pain:walking, "just hurt" (sharp)  **Pt. Reports feeling much steadier on her feet following treatment "I don't need that cane now"  Patient Goals: Decrease leakage, improve balance.   OBJECTIVE  Changes in: Posture/Observations:  R up-slip, R anterior rotation R  lumbar/L thoracic scoliosis LLE long by 1 cm.  ** following treatment Pt. Had PSIS level in standing with heel-lift in place.  Pt. Demonstrated decreased unsteadiness/shakiness in standing when external pressure applied to stabilize pelvis.  Range of Motion/Flexibilty:  Decreased hip EXT B  Strength/MMT:  LE MMT:  Pelvic floor:  Abdominal:   Palpation: TTP to R QL  Gait Analysis:  INTERVENTIONS THIS SESSION: Manual: Performed TP release and STM to R QL followed by MET correction for R anterior rotation x2 and R up-slip correction x2 to decrease spasm and pain and allow for  improved balance of musculature and pelvic alignment for improved function and decreased symptoms.  Self-care: Educated on how to gradually increase wear-time with her heel-lift to allow for a smooth transition and prevent increased pain.  Total time:  55mn.                              PT Short Term Goals - 01/03/20 1214      PT SHORT TERM GOAL #1   Title Patient will demonstrate improved pelvic alignment and balance of musculature surrounding the pelvis to facilitate decreased PFM spasms and decrease pelvic pain.    Baseline R up-slip and L anterior rotation    Time 10    Period Weeks    Status New    Target Date 03/13/20      PT SHORT TERM GOAL #2   Title Pt. will describe resolution of dizziness with positional changes to decrease risk of fall and increase ability to participate in HEP safely.    Baseline Pt. describes dizziness with turning her head and positional changes.    Time 5    Period Weeks    Status New    Target Date 02/07/20      PT SHORT TERM GOAL #3   Title Patient will demonstrate HEP x1 in the clinic to demonstrate understanding and proper form to allow for further improvement.    Baseline Pt. lacks knowledge of therapeutic exercises that will help decrease her SUI and increase her balance.    Time 5    Period Weeks    Status New    Target Date 02/07/20      PT SHORT TERM GOAL #4   Title Pt. will be able to walk to and from waiting room without using furniture or walls for support to demonstrate increased confindence in her balance.    Baseline Pt. not using an appropriate AD, participating in furniture walking due to decreased confidence in her balance    Time 5    Period Weeks    Status New    Target Date 02/07/20      PT SHORT TERM GOAL #5   Title Patient will demonstrate a coordinated contraction, relaxation, and bulge of the pelvic floor muscles to demonstrate functional recruitment and motion and allow for further  strengthening.    Baseline Pt. has breathing dysfunction and SUI indicating poor PFM coordination    Time 10    Period Weeks    Status New    Target Date 03/13/20             PT Long Term Goals - 01/03/20 1256      PT LONG TERM GOAL #1   Title Patient will report no episodes of SUI over the course of the prior two weeks to demonstrate improved functional ability.    Baseline SUI with activity and  cough/sneeze/etc. using 4 incontinence pads per day.    Time 20    Period Weeks    Status New    Target Date 05/22/20      PT LONG TERM GOAL #2   Title Patient will demonstrate 4+/5 strength or better in the PF and surrounding musculature to show improved functional strength for improved balance with exercise and other vocational and recreational activities.    Baseline Pt. describes feeling of weakness and demonstrates decreased balance, MMT to be established at follow-up    Time 20    Period Weeks    Status New    Target Date 05/22/20      PT LONG TERM GOAL #3   Title Pt. will improve in FOTO score by 10 points to demonstrate improved function.    Baseline FOTO PFDI Urinary: 17, Urinary Problem: 41    Time 10    Period Weeks    Status New    Target Date 03/13/20      PT LONG TERM GOAL #4   Title Pt. will be able to participate in light to moderate level exercise activity without increased pain or leakage to allow for long-term improved mental and physical well-being.    Baseline Pt. is frustrated that she cannot do her yoga classes due to her Sx.    Time 20    Period Weeks    Status New    Target Date 05/22/20                 Plan - 01/17/20 1606    Clinical Impression Statement Pt. Responded well to all interventions today, demonstrating decreased spasm, improved pelvic alignment and stability in standing, as well as understanding and correct performance of all education and exercises provided today. They will continue to benefit from skilled physical therapy to  work toward remaining goals and maximize function as well as decrease likelihood of symptom increase or recurrence.     PT Next Visit Plan Give side-stretch, bow-and-arrow and weight-shifts/balance    PT Home Exercise Plan Supine hip-flexor release with hold-relax lengthening. Diaphragmatic breathing.    Consulted and Agree with Plan of Care Patient           Patient will benefit from skilled therapeutic intervention in order to improve the following deficits and impairments:     Visit Diagnosis: Other idiopathic scoliosis, thoracolumbar region  Other muscle spasm  Difficulty in walking, not elsewhere classified  Dizziness and giddiness     Problem List Patient Active Problem List   Diagnosis Date Noted  . Venous reflux 08/05/2017  . Obstructive sleep apnea 07/17/2017  . B12 deficiency 07/16/2017  . Polypharmacy 07/16/2017  . Depression 07/15/2017  . Allergic rhinitis 06/24/2017  . Anxiety 06/24/2017  . Osteoarthritis 06/23/2017  . Atypical chest pain 06/23/2017  . Chronic back pain 06/23/2017  . Female stress incontinence 06/23/2017  . Hypercholesterolemia 06/23/2017  . Osteoporosis 06/23/2017  . Situational stress 06/23/2017  . Vitamin D deficiency 06/23/2017  . Gait instability 06/23/2017  . RLS (restless legs syndrome) 12/31/2016  . Insomnia 12/31/2016  . Urinary incontinence 12/31/2016  . Arthralgia 12/31/2016  . DNAR (do not attempt resuscitation) 05/30/2014  . Cataract 06/18/2012  . Dizziness 03/02/2012  . Referred otalgia 03/02/2012   Brittany Browning DPT, ATC Brittany Browning 01/17/2020, 4:08 PM  [place] MAIN [facility] SERVICES [address] [place], [place], [address] Phone: [phone]   Fax:  [phone]  Name: Brittany Browning MRN:  [id number] Date of Birth: 08-01-1941

## 2020-01-17 NOTE — Patient Instructions (Signed)

## 2020-01-24 ENCOUNTER — Other Ambulatory Visit: Payer: Self-pay

## 2020-01-24 ENCOUNTER — Encounter: Payer: Self-pay | Admitting: Physical Therapy

## 2020-01-24 ENCOUNTER — Ambulatory Visit: Payer: Medicare Other | Admitting: Physical Therapy

## 2020-01-24 DIAGNOSIS — M62838 Other muscle spasm: Secondary | ICD-10-CM

## 2020-01-24 DIAGNOSIS — M4125 Other idiopathic scoliosis, thoracolumbar region: Secondary | ICD-10-CM | POA: Diagnosis not present

## 2020-01-24 DIAGNOSIS — R262 Difficulty in walking, not elsewhere classified: Secondary | ICD-10-CM

## 2020-01-24 DIAGNOSIS — R42 Dizziness and giddiness: Secondary | ICD-10-CM

## 2020-01-24 NOTE — Therapy (Signed)
[place] MAIN [facility] SERVICES [address] [place], [place], [address] Phone: [phone]   Fax:  [phone]  Physical Therapy Treatment  Patient Details  Name: Brittany Browning MRN: [id number] Date of Birth: [birth year] No data recorded  Encounter Date: 01/24/2020   PT End of Session - 01/24/20 1300    Visit Number 4    Number of Visits 20    Date for PT Re-Evaluation 05/22/20    Authorization Type medicare/BCBS    Authorization Time Period 01/03/20 through 05/22/20    Authorization - Visit Number 3    Authorization - Number of Visits 20    Progress Note Due on Visit 10    PT Start Time 1300    PT Stop Time 1400   Activity Tolerance Patient tolerated treatment well;No increased pain    Behavior During Therapy [facility] for tasks assessed/performed;Anxious           Past Medical History:  Diagnosis Date  . Anemia   . Anxiety   . Arthritis    osteoarthritis  . Basal cell carcinoma (BCC)   . Cataract   . Complication of anesthesia    has had problems in the past. can tolerate propofol  . Environmental allergies   . Insomnia   . Osteopenia   . Restless leg syndrome   . Stress incontinence   . Willis-Ekbom syndrome     Past Surgical History:  Procedure Laterality Date  . CATARACT EXTRACTION W/PHACO Left 10/20/2017   Procedure: CATARACT EXTRACTION PHACO AND INTRAOCULAR LENS PLACEMENT (IOC);  Surgeon: Birder Robson, MD;  Location: [facility] ORS;  Service: Ophthalmology;  Laterality: Left;  [name] 00:39.7 AP% 12.9 CDE 5.09 Fluid Pack Lot # [id number]  . COLONOSCOPY    . SKIN CANCER EXCISION    . TONSILLECTOMY AND ADENOIDECTOMY  1944    There were no vitals filed for this visit.   Subjective Assessment - 01/27/20 1249    Subjective Patient reports that she has had a bad week.    Pertinent History Patient reports she has been having issues with feeling off balance which started after she fell in August 2020. Patient broke a bone in her  left ankle that was treated non-surgically. Per MR, patient had a fith metatarsal fracture.  Patient reports as a child she also had several fractures in her left leg which she said never healed properly. Patient reports years ago she had a humming in her head and saw a MD in [place] and she was given some exercises that she reports helped. Patient reports she cannot stand for long periods of time because she feels like she is going to fall because her legs are getting shaky not because of weakness but rather restless leg syndrome. Patient reports that she gets more unsteady when she makes turns and when she looks up and down. Patient reports if she walks straight she does not have an issue. Patient reports she sometimes has low blood pressure. 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 Patient reports she has to be so deliberate with everything she does and states she has to be very careful. Patient reports that her symptoms can occur several times a day depending upon what she is doing. Patient reports she had PT after her ankle fracture (no surgery). Patient reports she has a VNG test scheduled for 01/26/2020. Patient reports that she avoids bending over because she is nervous that she might get unsteady.  Patient reports that she has been modifying her activities or avoiding activities due to her unsteadiness and off-balance sensations.    Limitations Standing;Walking    Diagnostic tests VNG scheduled for 01/26/20    Patient Stated Goals to have improved balance and              [facility] PT Assessment - 01/27/20 1237      Standardized Balance Assessment   Standardized Balance Assessment Dynamic Gait Index      Dynamic Gait Index   Level Surface Mild Impairment    Change in Gait Speed Mild Impairment    Gait with Horizontal Head Turns Moderate Impairment    Gait with Vertical Head Turns Mild Impairment    Gait and Pivot Turn Normal    Step Over Obstacle  Moderate Impairment    Step Around Obstacles Mild Impairment    Steps Mild Impairment    Total Score 15            VESTIBULAR AND BALANCE Assessment   HISTORY:  Subjective history of current problem: Patient reports she has been having issues with feeling off balance which started after she fell in August 2020. Patient broke a bone in her left ankle that was treated non-surgically. Per MR, patient had a fith metatarsal fracture. Patient reports as a child she also had several fractures in her left leg which she said never healed properly. Patient reports years ago she had a humming in her head and saw a MD in [place] and she was given some exercises that she reports helped. Patient reports she cannot stand for long periods of time because she feels like she is going to fall because her legs are getting shaky not because of weakness but rather restless leg syndrome. Patient reports that she gets more unsteady when she makes turns and when she looks up and down. Patient reports if she walks straight she does not have an issue. Patient reports she sometimes has low blood pressure. 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 Patient reports she has to be so deliberate with everything she does and states she has to be very careful. Patient reports that her symptoms can occur several times a day depending upon what she is doing. Patient reports she had PT after her ankle fracture (no surgery). Patient reports she has a VNG test scheduled for 01/26/2020. Patient reports that she avoids bending over because she is nervous that she might get unsteady. Patient reports that she has been modifying her activities or avoiding activities due to her unsteadiness and off-balance sensations. Patient lives in a duplex cottage at [facility].  Patient reports she has had a bad week. Description of dizziness: unsteadiness, falling  Progression of symptoms:  no change since onset History of similar episodes: yes  Falls (yes/no): yes Number of falls in past 6 months: reports 1 about 3-4 weeks ago when she went to stand up; fall last September 2020  Auditory complaints (tinnitus, pain, drainage): denies Vision (last eye exam, diplopia, recent changes): patient wears glasses; patient reports she has had cataract surgery    EXAMINATION   SOMATOSENSORY:  Any N & T in extremities or weakness: restless legs; patient reports she has to move her fingers and toes.  Intact forearms, wrist and hands and mid femur down to ankles bilaterally.  MUSCULOSKELETAL SCREEN: Cervical Spine ROM: AROM cervical flexion, extension and left and right rotation [facility]  Gait: Patient  ambulates with decreased gait speed with uneven steppage with WBOS. Patient with imbalance with ambulation with head turns and body turns. Scanning of visual environment with gait is: fair  Balance: Patient is challenged by narrow BOS, uneven surfaces, ambulation with head turns and body turns, stairs, and eyes closed activities.  POSTURAL CONTROL TESTS:  Clinical Test of Sensory Interaction for Balance (CTSIB):  CONDITION TIME STRATEGY SWAY  Eyes open, firm surface 30 seconds ankle +1  Eyes closed, firm surface 30 seconds ankle +3  Eyes open, foam surface 30 seconds ankle +2  Eyes closed, foam surface 5 seconds ankle +4    OCULOMOTOR / VESTIBULAR TESTING: Oculomotor Exam- Room Light  Normal Abnormal Comments  Ocular Alignment N    Ocular ROM N    Spontaneous Nystagmus N    Gaze evoked Nystagmus N    Smooth Pursuit  Abn Multiple saccadic movements noted  Saccades  Abn Noted overshoots with vertical upper and lower field saccade testing  VOR   Deferred secondary to VNG test scheduled on 01/26/2020  VOR Cancellation N    Left Head Impulse   Deferred secondary to VNG test scheduled on 01/26/2020 and patient nervous about creating symptoms where she might not be able to drive this  date.   Right Head Impulse   Deferred secondary to VNG test scheduled on 01/26/2020 and patient nervous about creating symptoms where she might not be able to drive this date.    BPPV TESTS:  Symptoms Duration Intensity Nystagmus  Left Dix-Hallpike None   None observed  Right Dix-Hallpike None   None observed  Left Head Roll None   None observed  Right Head Roll None   None observed     FUNCTIONAL OUTCOME MEASURES:  Results Comments  DHI 54/100 Moderate perception of handicap; in need of intervention  ABC Scale 31.3% High falls risk; in need of intervention  DGI 15/24 Falls risk; in need of intervention  FOTO 64/100 Given the patient's risk adjustment variables, like-patients nationally had a FS score of 53  at intake       PT Education - 01/24/20 1301    Education Details discussed plan of care and goals   Person(s) Educated Patient   Methods Explanation    Comprehension Verbalized understanding            PT Short Term Goals - 01/03/20 1214      PT SHORT TERM GOAL #1   Title Patient will demonstrate improved pelvic alignment and balance of musculature surrounding the pelvis to facilitate decreased PFM spasms and decrease pelvic pain.    Baseline R up-slip and L anterior rotation    Time 10    Period Weeks    Status New    Target Date 03/13/20      PT SHORT TERM GOAL #2   Title Pt. will describe resolution of dizziness with positional changes to decrease risk of fall and increase ability to participate in HEP safely.    Baseline Pt. describes dizziness with turning her head and positional changes.    Time 5    Period Weeks    Status New    Target Date 02/07/20      PT SHORT TERM GOAL #3   Title Patient will demonstrate HEP x1 in the clinic to demonstrate understanding and proper form to allow for further improvement.    Baseline Pt. lacks knowledge of therapeutic exercises that will help decrease her SUI and increase her balance.    Time  5    Period Weeks     Status New    Target Date 02/07/20      PT SHORT TERM GOAL #4   Title Pt. will be able to walk to and from waiting room without using furniture or walls for support to demonstrate increased confindence in her balance.    Baseline Pt. not using an appropriate AD, participating in furniture walking due to decreased confidence in her balance    Time 5    Period Weeks    Status New    Target Date 02/07/20      PT SHORT TERM GOAL #5   Title Patient will demonstrate a coordinated contraction, relaxation, and bulge of the pelvic floor muscles to demonstrate functional recruitment and motion and allow for further strengthening.    Baseline Pt. has breathing dysfunction and SUI indicating poor PFM coordination    Time 10    Period Weeks    Status New    Target Date 03/13/20             PT Long Term Goals - 01/27/20 1302      PT LONG TERM GOAL #1   Title Patient will report no episodes of SUI over the course of the prior two weeks to demonstrate improved functional ability.    Baseline SUI with activity and cough/sneeze/etc. using 4 incontinence pads per day.    Time 20    Period Weeks    Status New    Target Date 05/22/20      PT LONG TERM GOAL #2   Title Patient will demonstrate 4+/5 strength or better in the PF and surrounding musculature to show improved functional strength for improved balance with exercise and other vocational and recreational activities.    Baseline Pt. describes feeling of weakness and demonstrates decreased balance, MMT to be established at follow-up    Time 20    Period Weeks    Status New      PT LONG TERM GOAL #3   Title Pt. will improve in FOTO score by 10 points to demonstrate improved function.    Baseline FOTO PFDI Urinary: 17, Urinary Problem: 41    Time 10    Period Weeks    Status New      PT LONG TERM GOAL #4   Title Pt. will be able to participate in light to moderate level exercise activity without increased pain or leakage to allow for  long-term improved mental and physical well-being.    Baseline Pt. is frustrated that she cannot do her yoga classes due to her Sx.    Time 20    Period Weeks    Status New      PT LONG TERM GOAL #5   Title Patient will have improved balance as evidenced by a score of 20/24 or better on the DGI.    Baseline scored 15/24 on 01/24/2020    Time 20    Period Weeks    Status New    Target Date 05/22/20      Additional Long Term Goals   Additional Long Term Goals Yes      PT LONG TERM GOAL #6   Title Patient will reduce falls risk as indicated by Activities Specific Balance Confidence Scale (ABC) >67%.    Baseline scored 31% on 01/24/2020    Time 20    Period Weeks    Status New    Target Date 05/22/20  Plan - 01/27/20 1306    Clinical Impression Statement Patient with potential indicators of central signs with vestibular oculo-motor testing secondary to patient with abnormal smooth pursuits and saccades. Patient with negative canal testing and deferred head thrust and VOR testing secondary to patient states she has a VNG test scheduled for 01/26/2020. Patient is at high falls irsk as evidenced by a ABC score of 31% and DGI score of 15/24. Patient scored 54/100 on the [facility] indicating moderate perception of handicap. Patient reports that she has imbalance and that she has been significantly modifying her activities as a result. Patient demonstrated +3 sway with eyes closed, feet together on firm surface and +4 sway and only able to hold position for 5 seconds with eyes closed and feet together on Airex pad. Added two long term goals focused on balance deficits this date. Patient would benefit from continued PT services to address goals and functional deficits and in order to decrease patient's falls risk.    Personal Factors and Comorbidities Comorbidity 3+    Comorbidities restless leg syndrome, sciliosis, anxiety, osteopenia, insomnia and a fall in August 2020 resulting in a L  wrist fracture and L foot in a boot for months    Examination-Activity Limitations Continence;Sleep;Locomotion Level;Bend;Transfers    Examination-Participation Restrictions Community Activity    Stability/Clinical Decision Making Unstable/Unpredictable    Rehab Potential Good    PT Frequency 1x / week    PT Duration Other (comment)   20 weeks   PT Treatment/Interventions ADLs/Self Care Home Management;Biofeedback;Moist Heat;Electrical Stimulation;Traction;Ultrasound;Therapeutic activities;Functional mobility training;DME Instruction;Stair training;Gait training;Therapeutic exercise;Balance training;Neuromuscular re-education;Patient/family education;Orthotic Fit/Training;Manual techniques;Taping;Dry needling;Passive range of motion;Vestibular;Spinal Manipulations;Joint Manipulations    PT Next Visit Plan address R up-slip and L anterior rotation, give hip-flexor stretch, bow-and-arrow and side-stretch    Consulted and Agree with Plan of Care Patient                 Patient will benefit from skilled therapeutic intervention in order to improve the following deficits and impairments:     Visit Diagnosis: Other idiopathic scoliosis, thoracolumbar region  Other muscle spasm  Difficulty in walking, not elsewhere classified  Dizziness and giddiness     Problem List Patient Active Problem List   Diagnosis Date Noted  . Venous reflux 08/05/2017  . Obstructive sleep apnea 07/17/2017  . B12 deficiency 07/16/2017  . Polypharmacy 07/16/2017  . Depression 07/15/2017  . Allergic rhinitis 06/24/2017  . Anxiety 06/24/2017  . Osteoarthritis 06/23/2017  . Atypical chest pain 06/23/2017  . Chronic back pain 06/23/2017  . Female stress incontinence 06/23/2017  . Hypercholesterolemia 06/23/2017  . Osteoporosis 06/23/2017  . Situational stress 06/23/2017  . Vitamin D deficiency 06/23/2017  . Gait instability 06/23/2017  . RLS (restless legs syndrome) 12/31/2016  . Insomnia  12/31/2016  . Urinary incontinence 12/31/2016  . Arthralgia 12/31/2016  . DNAR (do not attempt resuscitation) 05/30/2014  . Cataract 06/18/2012  . Dizziness 03/02/2012  . Referred otalgia 03/02/2012   Lady Deutscher PT, DPT [phone] Lady Deutscher 01/24/2020, 1:01 PM  [place] MAIN [facility] SERVICES [address] [place], [place], [address] Phone: [phone]   Fax:  [phone]  Name: Brittany Browning MRN: [id number] Date of Birth: October 10, 1940

## 2020-01-26 ENCOUNTER — Ambulatory Visit: Payer: Medicare Other

## 2020-01-31 ENCOUNTER — Ambulatory Visit: Payer: Medicare Other | Admitting: Physical Therapy

## 2020-01-31 ENCOUNTER — Ambulatory Visit: Payer: Medicare Other

## 2020-01-31 ENCOUNTER — Other Ambulatory Visit: Payer: Self-pay

## 2020-01-31 DIAGNOSIS — R42 Dizziness and giddiness: Secondary | ICD-10-CM

## 2020-01-31 DIAGNOSIS — M4125 Other idiopathic scoliosis, thoracolumbar region: Secondary | ICD-10-CM

## 2020-01-31 DIAGNOSIS — M62838 Other muscle spasm: Secondary | ICD-10-CM

## 2020-01-31 DIAGNOSIS — R262 Difficulty in walking, not elsewhere classified: Secondary | ICD-10-CM

## 2020-01-31 NOTE — Therapy (Signed)
[place] MAIN [facility] SERVICES [address] [place], [place], [address] Phone: [phone]   Fax:  [phone]  Physical Therapy Treatment  Patient Details  Name: Brittany Browning MRN: [id number] Date of Birth: October 13, 1940 No data recorded  Encounter Date: 01/31/2020   PT End of Session - 01/31/20 0911    Visit Number 5    Number of Visits 20    Date for PT Re-Evaluation 05/22/20    Authorization Type medicare/BCBS    Authorization Time Period 01/03/20 through 05/22/20    Authorization - Visit Number 3    Authorization - Number of Visits 20    Progress Note Due on Visit 10    PT Start Time 0912    PT Stop Time 1000    PT Time Calculation (min) 48 min    Activity Tolerance Patient tolerated treatment well    Behavior During Therapy [facility] for tasks assessed/performed           Past Medical History:  Diagnosis Date  . Anemia   . Anxiety   . Arthritis    osteoarthritis  . Basal cell carcinoma (BCC)   . Cataract   . Complication of anesthesia    has had problems in the past. can tolerate propofol  . Environmental allergies   . Insomnia   . Osteopenia   . Restless leg syndrome   . Stress incontinence   . Willis-Ekbom syndrome     Past Surgical History:  Procedure Laterality Date  . CATARACT EXTRACTION W/PHACO Left 10/20/2017   Procedure: CATARACT EXTRACTION PHACO AND INTRAOCULAR LENS PLACEMENT (IOC);  Surgeon: Birder Robson, MD;  Location: [facility] ORS;  Service: Ophthalmology;  Laterality: Left;  [name] 00:39.7 AP% 12.9 CDE 5.09 Fluid Pack Lot # [id number]  . COLONOSCOPY    . SKIN CANCER EXCISION    . TONSILLECTOMY AND ADENOIDECTOMY  1944    There were no vitals filed for this visit.   Subjective Assessment - 02/01/20 1008    Subjective Patient reports that she had the VNG test last week, but she does not know the results. Patient reports she has a follow up appointment at ENT office on July 1st. Patient states she was negative  for crystals, but states the test when they put warm and cold air/water in her ear, she said it made her very dizzy. Patient reports she has had a terrible last few weeks due to her legs and hands bothering her.    Pertinent History Patient reports she has been having issues with feeling off balance which started after she fell in August 2020. Patient broke a bone in her left ankle that was treated non-surgically. Per MR, patient had a fith metatarsal fracture.  Patient reports as a child she also had several fractures in her left leg which she said never healed properly. Patient reports years ago she had a humming in her head and saw a MD in [place] and she was given some exercises that she reports helped. Patient reports she cannot stand for long periods of time because she feels like she is going to fall because her legs are getting shaky not because of weakness but rather restless leg syndrome. Patient reports that she gets more unsteady when she makes turns and when she looks up and down. Patient reports if she walks straight she does not have an issue. Patient reports she sometimes has low blood pressure. 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 Patient reports she has to be so deliberate with everything she does and states she has to be very careful. Patient reports that her symptoms can occur several times a day depending upon what she is doing. Patient reports she had PT after her ankle fracture (no surgery). Patient reports she has a VNG test scheduled for 01/26/2020. Patient reports that she avoids bending over because she is nervous that she might get unsteady. Patient reports that she has been modifying her activities or avoiding activities due to her unsteadiness and off-balance sensations.    Limitations Standing;Walking    Diagnostic tests VNG scheduled for 01/26/20    Patient Stated Goals to have improved balance and          Patient reports that  she had the VNG test last week, but she does not know the results. Patient reports she has a follow up appointment at ENT office on July 1st. Patient states she was negative for crystals, but states the test when they put warm and cold air/water in her ear, she said it made her very dizzy.  Patient reports she has had a terrible last few weeks due to her legs and hands bothering her.    Neuromuscular Re-education:  VOR X 1 exercise:  Discussed and demonstrated VOR X1.  Patient performed VOR X 1 horizontal in sitting 3 reps of 1 minute each with verbal cues for technique.  Patent rates her dizziness as 1.5/10.  Non-compliant/ Firm Surface: On firm surface and then repeated on Airex pad, patient performed feet together progressions and semi-tandem progressions with alternating lead leg with and without horizontal and vertical head turns.  Patient reports this activity is challenging for her balance.  Performed standing in corner with chair in front for safety.   Positive right and negative left head thrust test  Patient returns she had the VNG test last week and states that it was negative for crystals, but the warm and cold air/water made her very dizzy. Patient with positive right head thrust and negative left head thrust test this date. Patient issued sitting VOR X 1 and standing feet together and semi-tandem progressions with and without head turns. Patient challenged by narrow base of support, uneven surfaces and head turning activities this date. Patient would benefit from continued PT services to further address goals and to try to reduce patient's fall risk.    PT Education - 02/01/20 1008    Education Details issued VORx1, feet together and semi-tandem stance progressions with horizontal and vertical head turns; discussed standing in a corner with chair in front for safety for HEP    Person(s) Educated Patient    Methods Explanation;Demonstration;Verbal cues;Handout    Comprehension  Verbalized understanding;Returned demonstration            PT Short Term Goals - 01/03/20 1214      PT SHORT TERM GOAL #1   Title Patient will demonstrate improved pelvic alignment and balance of musculature surrounding the pelvis to facilitate decreased PFM spasms and decrease pelvic pain.    Baseline R up-slip and L anterior rotation    Time 10    Period Weeks    Status New    Target Date 03/13/20      PT SHORT TERM GOAL #2   Title Pt. will describe resolution of dizziness with positional changes to decrease risk of fall and increase ability to participate in HEP safely.    Baseline Pt. describes dizziness with turning her head and  positional changes.    Time 5    Period Weeks    Status New    Target Date 02/07/20      PT SHORT TERM GOAL #3   Title Patient will demonstrate HEP x1 in the clinic to demonstrate understanding and proper form to allow for further improvement.    Baseline Pt. lacks knowledge of therapeutic exercises that will help decrease her SUI and increase her balance.    Time 5    Period Weeks    Status New    Target Date 02/07/20      PT SHORT TERM GOAL #4   Title Pt. will be able to walk to and from waiting room without using furniture or walls for support to demonstrate increased confindence in her balance.    Baseline Pt. not using an appropriate AD, participating in furniture walking due to decreased confidence in her balance    Time 5    Period Weeks    Status New    Target Date 02/07/20      PT SHORT TERM GOAL #5   Title Patient will demonstrate a coordinated contraction, relaxation, and bulge of the pelvic floor muscles to demonstrate functional recruitment and motion and allow for further strengthening.    Baseline Pt. has breathing dysfunction and SUI indicating poor PFM coordination    Time 10    Period Weeks    Status New    Target Date 03/13/20             PT Long Term Goals - 01/27/20 1302      PT LONG TERM GOAL #1   Title  Patient will report no episodes of SUI over the course of the prior two weeks to demonstrate improved functional ability.    Baseline SUI with activity and cough/sneeze/etc. using 4 incontinence pads per day.    Time 20    Period Weeks    Status New    Target Date 05/22/20      PT LONG TERM GOAL #2   Title Patient will demonstrate 4+/5 strength or better in the PF and surrounding musculature to show improved functional strength for improved balance with exercise and other vocational and recreational activities.    Baseline Pt. describes feeling of weakness and demonstrates decreased balance, MMT to be established at follow-up    Time 20    Period Weeks    Status New      PT LONG TERM GOAL #3   Title Pt. will improve in FOTO score by 10 points to demonstrate improved function.    Baseline FOTO PFDI Urinary: 17, Urinary Problem: 41    Time 10    Period Weeks    Status New      PT LONG TERM GOAL #4   Title Pt. will be able to participate in light to moderate level exercise activity without increased pain or leakage to allow for long-term improved mental and physical well-being.    Baseline Pt. is frustrated that she cannot do her yoga classes due to her Sx.    Time 20    Period Weeks    Status New      PT LONG TERM GOAL #5   Title Patient will have improved balance as evidenced by a score of 20/24 or better on the DGI.    Baseline scored 15/24 on 01/24/2020    Time 20    Period Weeks    Status New    Target Date 05/22/20  Additional Long Term Goals   Additional Long Term Goals Yes      PT LONG TERM GOAL #6   Title Patient will reduce falls risk as indicated by Activities Specific Balance Confidence Scale (ABC) >67%.    Baseline scored 31% on 01/24/2020    Time 20    Period Weeks    Status New    Target Date 05/22/20                 Plan - 02/01/20 1026    Clinical Impression Statement Patient returns she had the VNG test last week and states that it was  negative for crystals, but the warm and cold air/water made her very dizzy. Patient with positive right head thrust and negative left head thrust test this date. Patient issued sitting VOR X 1 and standing feet together and semi-tandem progressions with and without head turns. Patient challenged by narrow base of support, uneven surfaces and head turning activities this date. Patient would benefit from continued PT services to further address goals and to try to reduce patient's fall risk.    Personal Factors and Comorbidities Comorbidity 3+    Comorbidities restless leg syndrome, sciliosis, anxiety, osteopenia, insomnia and a fall in August 2020 resulting in a L wrist fracture and L foot in a boot for months    Examination-Activity Limitations Continence;Sleep;Locomotion Level;Bend;Transfers    Examination-Participation Restrictions Community Activity    Stability/Clinical Decision Making Unstable/Unpredictable    Rehab Potential Good    PT Frequency 1x / week    PT Duration Other (comment)   20 weeks   PT Treatment/Interventions ADLs/Self Care Home Management;Biofeedback;Moist Heat;Electrical Stimulation;Traction;Ultrasound;Therapeutic activities;Functional mobility training;DME Instruction;Stair training;Gait training;Therapeutic exercise;Balance training;Neuromuscular re-education;Patient/family education;Orthotic Fit/Training;Manual techniques;Taping;Dry needling;Passive range of motion;Vestibular;Spinal Manipulations;Joint Manipulations    PT Next Visit Plan address R up-slip and L anterior rotation, give hip-flexor stretch, bow-and-arrow and side-stretch    Consulted and Agree with Plan of Care Patient           Patient will benefit from skilled therapeutic intervention in order to improve the following deficits and impairments:  Abnormal gait, Decreased balance, Decreased endurance, Difficulty walking, Increased muscle spasms, Decreased range of motion, Dizziness, Improper body mechanics,  Impaired tone, Decreased coordination, Decreased strength, Increased fascial restricitons, Impaired flexibility, Postural dysfunction, Pain  Visit Diagnosis: Other idiopathic scoliosis, thoracolumbar region  Other muscle spasm  Difficulty in walking, not elsewhere classified  Dizziness and giddiness     Problem List Patient Active Problem List   Diagnosis Date Noted  . Venous reflux 08/05/2017  . Obstructive sleep apnea 07/17/2017  . B12 deficiency 07/16/2017  . Polypharmacy 07/16/2017  . Depression 07/15/2017  . Allergic rhinitis 06/24/2017  . Anxiety 06/24/2017  . Osteoarthritis 06/23/2017  . Atypical chest pain 06/23/2017  . Chronic back pain 06/23/2017  . Female stress incontinence 06/23/2017  . Hypercholesterolemia 06/23/2017  . Osteoporosis 06/23/2017  . Situational stress 06/23/2017  . Vitamin D deficiency 06/23/2017  . Gait instability 06/23/2017  . RLS (restless legs syndrome) 12/31/2016  . Insomnia 12/31/2016  . Urinary incontinence 12/31/2016  . Arthralgia 12/31/2016  . DNAR (do not attempt resuscitation) 05/30/2014  . Cataract 06/18/2012  . Dizziness 03/02/2012  . Referred otalgia 03/02/2012   Lady Deutscher PT, DPT [phone] Lady Deutscher 02/01/2020, 10:38 AM  [place] MAIN [facility] SERVICES [address] [place], [place], [address] Phone: [phone]   Fax:  [phone]  Name: Brittany Browning MRN: [id number] Date of Birth: Sep 02, 1940

## 2020-02-01 ENCOUNTER — Encounter: Payer: Self-pay | Admitting: Physical Therapy

## 2020-02-07 ENCOUNTER — Ambulatory Visit: Payer: Medicare Other | Attending: Family Medicine

## 2020-02-07 ENCOUNTER — Other Ambulatory Visit: Payer: Self-pay

## 2020-02-07 DIAGNOSIS — R42 Dizziness and giddiness: Secondary | ICD-10-CM | POA: Diagnosis present

## 2020-02-07 DIAGNOSIS — M62838 Other muscle spasm: Secondary | ICD-10-CM

## 2020-02-07 DIAGNOSIS — M4125 Other idiopathic scoliosis, thoracolumbar region: Secondary | ICD-10-CM

## 2020-02-07 DIAGNOSIS — R262 Difficulty in walking, not elsewhere classified: Secondary | ICD-10-CM | POA: Diagnosis present

## 2020-02-07 NOTE — Therapy (Signed)
[place] MAIN [facility] SERVICES [address] [place], [place], [address] Phone: [phone]   Fax:  [phone]  Physical Therapy Treatment  The patient has been informed of current processes in place at Outpatient Rehab to protect patients from Covid-19 exposure including social distancing, schedule modifications, and new cleaning procedures. After discussing their particular risk with a therapist based on the patient's personal risk factors, the patient has decided to proceed with in-person therapy.  Patient Details  Name: Brittany Browning MRN: [id number] Date of Birth: 20-Aug-1940 No data recorded  Encounter Date: 02/07/2020   PT End of Session - 02/07/20 1152    Visit Number 6    Number of Visits 20    Date for PT Re-Evaluation 05/22/20    Authorization Type medicare/BCBS    Authorization Time Period 01/03/20 through 05/22/20    Authorization - Visit Number 6    Authorization - Number of Visits 20    Progress Note Due on Visit 10    PT Start Time 5188    PT Stop Time 1145    PT Time Calculation (min) 68 min    Activity Tolerance Patient tolerated treatment well    Behavior During Therapy [facility] for tasks assessed/performed           Past Medical History:  Diagnosis Date  . Anemia   . Anxiety   . Arthritis    osteoarthritis  . Basal cell carcinoma (BCC)   . Cataract   . Complication of anesthesia    has had problems in the past. can tolerate propofol  . Environmental allergies   . Insomnia   . Osteopenia   . Restless leg syndrome   . Stress incontinence   . Willis-Ekbom syndrome     Past Surgical History:  Procedure Laterality Date  . CATARACT EXTRACTION W/PHACO Left 10/20/2017   Procedure: CATARACT EXTRACTION PHACO AND INTRAOCULAR LENS PLACEMENT (IOC);  Surgeon: Birder Robson, MD;  Location: [facility] ORS;  Service: Ophthalmology;  Laterality: Left;  [name] 00:39.7 AP% 12.9 CDE 5.09 Fluid Pack Lot # [id number]  . COLONOSCOPY    .  SKIN CANCER EXCISION    . TONSILLECTOMY AND ADENOIDECTOMY  1944    There were no vitals filed for this visit.    Pelvic Floor Physical Therapy Treatment Note  SCREENING  Changes in medications, allergies, or medical history?: Zofran,    SUBJECTIVE  Patient reports: Bladder may be a little bit better, not having as much leakage but has not been exercising as much. Has not been able to sleep well since needling. Back is doing well.  Has been wearing her heel-lift but is not wearing it today.   Precautions:  restless leg syndrome, scoliosis, anxiety, osteopenia, insomnia and a fall in August 2020 resulting in a L wrist fracture and L foot in a boot for months  Sexual activity/pain: None, but it has been a while. 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  Location of pain: L mid foot (sciatica on L) Current pain: 0/10 (0/10) Max pain: 4/10 (6/10) Least pain: 0/10 (0/10) Nature of pain:walking, "just hurt" (sharp)  **Pt. Reports feeling much steadier on her feet following treatment "I don't need that cane now"  Patient Goals: Decrease leakage, improve balance.   OBJECTIVE  Changes in: Posture/Observations:  L anterior rotation and L ilium high (Pt. Not wearing heel-lift for RLE)  R lumbar/L thoracic scoliosis.  LLE long by 1 cm.   Range of Motion/Flexibilty:  Decreased hip EXT B  Strength/MMT:  LE MMT:  Pt. Demonstrates shakiness in standing from RLS.  **Pt. Demonstrated decreased unsteadiness/shakiness in standing when external pressure applied to stabilize pelvis which was re-created and sustained with an SI belt.  Pelvic floor:  Abdominal:   Palpation: TTP to L QL and Iliacus  Gait Analysis: Pt. Able to walk in a straight line without using walls for support with SI belt in place.  INTERVENTIONS THIS SESSION: Manual: Performed TP release and STM to L QL and Iliacus to decrease spasm and pain and allow for improved balance of musculature and pelvic  alignment for improved function and decreased symptoms.  Self-care: Discussed potential causes for increased RLS and out POC to address this including using TP release rather than TPDN for a while to determine if the needling could have played a role.  Theract: Educated Pt. On theory that PFM spasms are contributing to her RLS and that weakness in the outer hip musculature is allowing for those spasms to act up more. Gave an SI belt to help give external support to the pelvis to allow for improved PFM relaxation and increased steadiness.  Therex: Educated on and practiced a side-stretch and replaced hip-flexor stretch at EOB with thomas-test hip-flexor stretch to improve efficacy and performance and help keep hip-flexors relaxed. Reviewed VOR x1 and gave cues to keep exes fixed on the x and only move the head as fast as she can without losing focus as Pt. Was letting her eyes move with her head and then glancing back.   Total time:  72min.                               PT Short Term Goals - 01/03/20 1214      PT SHORT TERM GOAL #1   Title Patient will demonstrate improved pelvic alignment and balance of musculature surrounding the pelvis to facilitate decreased PFM spasms and decrease pelvic pain.    Baseline R up-slip and L anterior rotation    Time 10    Period Weeks    Status New    Target Date 03/13/20      PT SHORT TERM GOAL #2   Title Pt. will describe resolution of dizziness with positional changes to decrease risk of fall and increase ability to participate in HEP safely.    Baseline Pt. describes dizziness with turning her head and positional changes.    Time 5    Period Weeks    Status New    Target Date 02/07/20      PT SHORT TERM GOAL #3   Title Patient will demonstrate HEP x1 in the clinic to demonstrate understanding and proper form to allow for further improvement.    Baseline Pt. lacks knowledge of therapeutic exercises that will help decrease  her SUI and increase her balance.    Time 5    Period Weeks    Status New    Target Date 02/07/20      PT SHORT TERM GOAL #4   Title Pt. will be able to walk to and from waiting room without using furniture or walls for support to demonstrate increased confindence in her balance.    Baseline Pt. not using an appropriate AD, participating in furniture walking due to decreased confidence in her balance    Time 5    Period Weeks    Status New    Target Date 02/07/20      PT  SHORT TERM GOAL #5   Title Patient will demonstrate a coordinated contraction, relaxation, and bulge of the pelvic floor muscles to demonstrate functional recruitment and motion and allow for further strengthening.    Baseline Pt. has breathing dysfunction and SUI indicating poor PFM coordination    Time 10    Period Weeks    Status New    Target Date 03/13/20             PT Long Term Goals - 01/27/20 1302      PT LONG TERM GOAL #1   Title Patient will report no episodes of SUI over the course of the prior two weeks to demonstrate improved functional ability.    Baseline SUI with activity and cough/sneeze/etc. using 4 incontinence pads per day.    Time 20    Period Weeks    Status New    Target Date 05/22/20      PT LONG TERM GOAL #2   Title Patient will demonstrate 4+/5 strength or better in the PF and surrounding musculature to show improved functional strength for improved balance with exercise and other vocational and recreational activities.    Baseline Pt. describes feeling of weakness and demonstrates decreased balance, MMT to be established at follow-up    Time 20    Period Weeks    Status New      PT LONG TERM GOAL #3   Title Pt. will improve in FOTO score by 10 points to demonstrate improved function.    Baseline FOTO PFDI Urinary: 17, Urinary Problem: 41    Time 10    Period Weeks    Status New      PT LONG TERM GOAL #4   Title Pt. will be able to participate in light to moderate level  exercise activity without increased pain or leakage to allow for long-term improved mental and physical well-being.    Baseline Pt. is frustrated that she cannot do her yoga classes due to her Sx.    Time 20    Period Weeks    Status New      PT LONG TERM GOAL #5   Title Patient will have improved balance as evidenced by a score of 20/24 or better on the DGI.    Baseline scored 15/24 on 01/24/2020    Time 20    Period Weeks    Status New    Target Date 05/22/20      Additional Long Term Goals   Additional Long Term Goals Yes      PT LONG TERM GOAL #6   Title Patient will reduce falls risk as indicated by Activities Specific Balance Confidence Scale (ABC) >67%.    Baseline scored 31% on 01/24/2020    Time 20    Period Weeks    Status New    Target Date 05/22/20                 Plan - 02/07/20 1153    Clinical Impression Statement Pt. Responded well to all interventions today, demonstrating improved steadiness in standing and with AMB with addition of SI belt, decreased TTP and spasms surrounding L hip,  as well as understanding and correct performance of all education and exercises provided today. They will continue to benefit from skilled physical therapy to work toward remaining goals and maximize function as well as decrease likelihood of symptom increase or recurrence.    PT Next Visit Plan Give bow-and-arrow and weight-shifts/balance    PT Home  Exercise Plan thomas-test hip-flexor stretch with hold-relax lengthening, side-stretch, VORx1, and Diaphragmatic breathing.    Consulted and Agree with Plan of Care Patient           Patient will benefit from skilled therapeutic intervention in order to improve the following deficits and impairments:     Visit Diagnosis: Other idiopathic scoliosis, thoracolumbar region  Other muscle spasm  Difficulty in walking, not elsewhere classified  Dizziness and giddiness     Problem List Patient Active Problem List    Diagnosis Date Noted  . Venous reflux 08/05/2017  . Obstructive sleep apnea 07/17/2017  . B12 deficiency 07/16/2017  . Polypharmacy 07/16/2017  . Depression 07/15/2017  . Allergic rhinitis 06/24/2017  . Anxiety 06/24/2017  . Osteoarthritis 06/23/2017  . Atypical chest pain 06/23/2017  . Chronic back pain 06/23/2017  . Female stress incontinence 06/23/2017  . Hypercholesterolemia 06/23/2017  . Osteoporosis 06/23/2017  . Situational stress 06/23/2017  . Vitamin D deficiency 06/23/2017  . Gait instability 06/23/2017  . RLS (restless legs syndrome) 12/31/2016  . Insomnia 12/31/2016  . Urinary incontinence 12/31/2016  . Arthralgia 12/31/2016  . DNAR (do not attempt resuscitation) 05/30/2014  . Cataract 06/18/2012  . Dizziness 03/02/2012  . Referred otalgia 03/02/2012   Willa Rough DPT, ATC Willa Rough 02/07/2020, 12:04 PM  [place] MAIN [facility] SERVICES [address] [place], [place], [address] Phone: [phone]   Fax:  [phone]  Name: Brittany Browning MRN: [id number] Date of Birth: Mar 18, 1941

## 2020-02-07 NOTE — Patient Instructions (Signed)
Available in 32 oz, 1/2 gallon, or 1 gallon sizes, all colors...... To help you increase your water intake, try getting a reusable water bottle with markings to help you keep up with your hydration throughout the day. You should be drinking ~ 50% of your bodyweight in Oz. Of water each day or an average of ~ 64 Oz.  You can also add fruit or cucumbers to your water to make it more interesting and desirable. Cucumbers and honeydew are good choices because they are not acidic like citrus fruit and they add a refreshing flavor.     Hold for 30 seconds (5 deep breaths) and repeat 2-3 times on each side once a day    Sit your bottom at the edge of the bed, pull your knee into your chest and keep it close as you lie back, feel the stretch down the front of the leg that is down. Hold for 5 deep breaths, repease 2-3 times for each side 1-2 times per day. You may use a towel to help hold the knee to your chest if needed.

## 2020-02-09 ENCOUNTER — Ambulatory Visit: Payer: Medicare Other

## 2020-02-14 ENCOUNTER — Encounter: Payer: Medicare Other | Admitting: Physical Therapy

## 2020-02-14 ENCOUNTER — Other Ambulatory Visit: Payer: Self-pay

## 2020-02-14 ENCOUNTER — Ambulatory Visit: Payer: Medicare Other

## 2020-02-14 DIAGNOSIS — R262 Difficulty in walking, not elsewhere classified: Secondary | ICD-10-CM

## 2020-02-14 DIAGNOSIS — M4125 Other idiopathic scoliosis, thoracolumbar region: Secondary | ICD-10-CM

## 2020-02-14 DIAGNOSIS — M62838 Other muscle spasm: Secondary | ICD-10-CM

## 2020-02-14 DIAGNOSIS — R42 Dizziness and giddiness: Secondary | ICD-10-CM

## 2020-02-14 NOTE — Patient Instructions (Signed)
Child's Pose Pelvic Floor Lengthening    Sit in knee-chest position and reach arms forward. Separate knees for comfort. Hold position for _5__ breaths. Repeat _2-3__ times. Do _1-2__ times per day.  "Pre-squeeze and sneeze"  Before you cough, sneeze, laugh etc. "pre-squeeze" the pelvic floor (kegel) and hold it until you finish coughing to retrain the muscles to hold the urine in during these activities.

## 2020-02-14 NOTE — Therapy (Signed)
[place] MAIN [facility] SERVICES [address] [place], [place], [address] Phone: [phone]   Fax:  [phone]  Physical Therapy Treatment  The patient has been informed of current processes in place at Outpatient Rehab to protect patients from Covid-19 exposure including social distancing, schedule modifications, and new cleaning procedures. After discussing their particular risk with a therapist based on the patient's personal risk factors, the patient has decided to proceed with in-person therapy.  Patient Details  Name: Brittany Browning MRN: [id number] Date of Birth: 1940/11/11 No data recorded  Encounter Date: 02/14/2020   PT End of Session - 02/14/20 1410    Visit Number 7    Number of Visits 20    Date for PT Re-Evaluation 05/22/20    Authorization Type medicare/BCBS    Authorization Time Period 01/03/20 through 05/22/20    Authorization - Visit Number 7    Authorization - Number of Visits 20    Progress Note Due on Visit 10    PT Start Time 1030    PT Stop Time 1130    PT Time Calculation (min) 60 min    Activity Tolerance Patient tolerated treatment well    Behavior During Therapy WFL for tasks assessed/performed           Past Medical History:  Diagnosis Date   Anemia    Anxiety    Arthritis    osteoarthritis   Basal cell carcinoma (BCC)    Cataract    Complication of anesthesia    has had problems in the past. can tolerate propofol   Environmental allergies    Insomnia    Osteopenia    Restless leg syndrome    Stress incontinence    Willis-Ekbom syndrome     Past Surgical History:  Procedure Laterality Date   CATARACT EXTRACTION W/PHACO Left 10/20/2017   Procedure: CATARACT EXTRACTION PHACO AND INTRAOCULAR LENS PLACEMENT ([place]);  Surgeon: Birder Robson, MD;  Location: [facility] ORS;  Service: Ophthalmology;  Laterality: Left;  [name] 00:39.7 AP% 12.9 CDE 5.09 Fluid Pack Lot # [id number]   COLONOSCOPY      SKIN CANCER EXCISION     TONSILLECTOMY AND ADENOIDECTOMY  1944    There were no vitals filed for this visit.   Pelvic Floor Physical Therapy Treatment Note  SCREENING  Changes in medications, allergies, or medical history?: Zofran,    SUBJECTIVE  Patient reports: Has leakage when she sneezes hard still, it is a little better than it was when we started. She is doing better with her legs since we "evened her out". She went to have an infusion for her osteopenia and that seemed to help with her legs as well.   Precautions:  restless leg syndrome, scoliosis, anxiety, osteopenia, insomnia and a fall in August 2020 resulting in a L wrist fracture and L foot in a boot for months  Sexual activity/pain: None, but it has been a while. 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  Location of pain: L mid foot (sciatica on L) Current pain: 0/10 (0/10) Max pain: 0/10 (0/10) Least pain: 0/10 (0/10) Nature of pain:walking, "just hurt" (sharp)  **no increased pain following treatment.  Patient Goals: Decrease leakage, improve balance.   OBJECTIVE  Changes in: Posture/Observations:  L anterior rotation and L ilium high (Pt. Not wearing heel-lift for RLE)  R lumbar/L thoracic scoliosis.  LLE long by 1 cm.   Range of Motion/Flexibilty:  Decreased hip EXT B (from previous note)  Strength/MMT:  LE MMT:  Pelvic Floor External Exam: Introitus Appears: WNL Skin integrity: age related atrophy Palpation: TTP to B STP and L IC Cough: parodoxical Prolapse visible?: none Scar mobility: decreased/tight bands near introitus.  Internal Vaginal Exam: Strength (PERF): 2+/5. 10 sec. 2 times. Symmetry: assymetrical Palpation: TTP to R OI>coccygeus>posterior PR/PC. TO L Coccygeus>OI>anterior PR/PC and at posterior fourchette. Prolapse: none   Internal Rectal Exam: Strength (PERF): Symmetry: Palpation: Prolapse:    Abdominal:   Palpation:  Gait Analysis: Pt. Arrived  without cane or SIJ belt and demonstrates much less unsteadiness and listing with AMB than at initial assessment  INTERVENTIONS THIS SESSION: Manual: Assessed PFM and performed TP release to the posterior fourchette to decrease spasm and pain and allow for improved balance of musculature for improved function and decreased symptoms.  Self-care: Assisted Pt. In putting SIJ belt back together and educated on how to don/doff it more easily due to difficulty removing velcro.  NM re-ed: Educated Pt. On pre-squeeze and sneeze to decreaese SUI by re-training reflex. Educated on and practiced how to do a coordinated kegel, relaxation, and bulge as well as kegel with posterior pelvic tilt to improve pt. PFM coordination overall as pt. Demonstrates PFM contraction during inhale before cueing. Review required.  Therex: educated on and practiced child's pose stretch to maintain and further improve improved PFM relaxation and length as well as improve awareness of where PFM are in space with breathing pattern.  Total time:  28min.                               PT Short Term Goals - 01/03/20 1214      PT SHORT TERM GOAL #1   Title Patient will demonstrate improved pelvic alignment and balance of musculature surrounding the pelvis to facilitate decreased PFM spasms and decrease pelvic pain.    Baseline R up-slip and L anterior rotation    Time 10    Period Weeks    Status New    Target Date 03/13/20      PT SHORT TERM GOAL #2   Title Pt. will describe resolution of dizziness with positional changes to decrease risk of fall and increase ability to participate in HEP safely.    Baseline Pt. describes dizziness with turning her head and positional changes.    Time 5    Period Weeks    Status New    Target Date 02/07/20      PT SHORT TERM GOAL #3   Title Patient will demonstrate HEP x1 in the clinic to demonstrate understanding and proper form to allow for further  improvement.    Baseline Pt. lacks knowledge of therapeutic exercises that will help decrease her SUI and increase her balance.    Time 5    Period Weeks    Status New    Target Date 02/07/20      PT SHORT TERM GOAL #4   Title Pt. will be able to walk to and from waiting room without using furniture or walls for support to demonstrate increased confindence in her balance.    Baseline Pt. not using an appropriate AD, participating in furniture walking due to decreased confidence in her balance    Time 5    Period Weeks    Status New    Target Date 02/07/20      PT SHORT TERM GOAL #5   Title Patient will demonstrate a coordinated contraction, relaxation, and bulge of the  pelvic floor muscles to demonstrate functional recruitment and motion and allow for further strengthening.    Baseline Pt. has breathing dysfunction and SUI indicating poor PFM coordination    Time 10    Period Weeks    Status New    Target Date 03/13/20             PT Long Term Goals - 01/27/20 1302      PT LONG TERM GOAL #1   Title Patient will report no episodes of SUI over the course of the prior two weeks to demonstrate improved functional ability.    Baseline SUI with activity and cough/sneeze/etc. using 4 incontinence pads per day.    Time 20    Period Weeks    Status New    Target Date 05/22/20      PT LONG TERM GOAL #2   Title Patient will demonstrate 4+/5 strength or better in the PF and surrounding musculature to show improved functional strength for improved balance with exercise and other vocational and recreational activities.    Baseline Pt. describes feeling of weakness and demonstrates decreased balance, MMT to be established at follow-up    Time 20    Period Weeks    Status New      PT LONG TERM GOAL #3   Title Pt. will improve in FOTO score by 10 points to demonstrate improved function.    Baseline FOTO PFDI Urinary: 17, Urinary Problem: 41    Time 10    Period Weeks    Status New       PT LONG TERM GOAL #4   Title Pt. will be able to participate in light to moderate level exercise activity without increased pain or leakage to allow for long-term improved mental and physical well-being.    Baseline Pt. is frustrated that she cannot do her yoga classes due to her Sx.    Time 20    Period Weeks    Status New      PT LONG TERM GOAL #5   Title Patient will have improved balance as evidenced by a score of 20/24 or better on the DGI.    Baseline scored 15/24 on 01/24/2020    Time 20    Period Weeks    Status New    Target Date 05/22/20      Additional Long Term Goals   Additional Long Term Goals Yes      PT LONG TERM GOAL #6   Title Patient will reduce falls risk as indicated by Activities Specific Balance Confidence Scale (ABC) >67%.    Baseline scored 31% on 01/24/2020    Time 20    Period Weeks    Status New    Target Date 05/22/20                 Plan - 02/14/20 1410    Clinical Impression Statement Pt. Responded well to all interventions today, demonstrating improved coordination and ability to recruit the PFM and deep core by ~ 50% as well as understanding and correct performance of all education and exercises provided today. They will continue to benefit from skilled physical therapy to work toward remaining goals and maximize function as well as decrease likelihood of symptom increase or recurrence.   PT Next Visit Plan Further PFM releaes and strengthening/coordination, Give bow-and-arrow and weight-shifts/balance, assess hip-strength and give HEP    PT Home Exercise Plan thomas-test hip-flexor stretch with hold-relax lengthening, side-stretch, VORx1, and Diaphragmatic breathing, child's pose  stretch and pre-squeeze and sneeze".    Consulted and Agree with Plan of Care Patient           Patient will benefit from skilled therapeutic intervention in order to improve the following deficits and impairments:     Visit Diagnosis: Other idiopathic  scoliosis, thoracolumbar region  Other muscle spasm  Difficulty in walking, not elsewhere classified  Dizziness and giddiness     Problem List Patient Active Problem List   Diagnosis Date Noted   Venous reflux 08/05/2017   Obstructive sleep apnea 07/17/2017   B12 deficiency 07/16/2017   Polypharmacy 07/16/2017   Depression 07/15/2017   Allergic rhinitis 06/24/2017   Anxiety 06/24/2017   Osteoarthritis 06/23/2017   Atypical chest pain 06/23/2017   Chronic back pain 06/23/2017   Female stress incontinence 06/23/2017   Hypercholesterolemia 06/23/2017   Osteoporosis 06/23/2017   Situational stress 06/23/2017   Vitamin D deficiency 06/23/2017   Gait instability 06/23/2017   RLS (restless legs syndrome) 12/31/2016   Insomnia 12/31/2016   Urinary incontinence 12/31/2016   Arthralgia 12/31/2016   DNAR (do not attempt resuscitation) 05/30/2014   Cataract 06/18/2012   Dizziness 03/02/2012   Referred otalgia 03/02/2012   Willa Rough DPT, ATC Willa Rough 02/14/2020, 2:12 PM  [place] MAIN [facility] SERVICES [address] [place], [place], [address] Phone: [phone]   Fax:  [phone]  Name: Brittany Browning MRN: [id number] Date of Birth: April 26, 1941

## 2020-02-21 ENCOUNTER — Encounter: Payer: Medicare Other | Admitting: Physical Therapy

## 2020-02-28 ENCOUNTER — Encounter: Payer: Medicare Other | Admitting: Physical Therapy

## 2020-02-28 ENCOUNTER — Other Ambulatory Visit: Payer: Self-pay

## 2020-02-28 ENCOUNTER — Ambulatory Visit
Admission: RE | Admit: 2020-02-28 | Discharge: 2020-02-28 | Disposition: A | Discharge status: Home / Self Care | Payer: Medicare Other | Source: Ambulatory Visit | Attending: Otolaryngology | Admitting: Otolaryngology

## 2020-02-28 ENCOUNTER — Ambulatory Visit: Payer: Medicare Other

## 2020-02-28 DIAGNOSIS — R42 Dizziness and giddiness: Secondary | ICD-10-CM | POA: Insufficient documentation

## 2020-02-28 DIAGNOSIS — M62838 Other muscle spasm: Secondary | ICD-10-CM

## 2020-02-28 DIAGNOSIS — M4125 Other idiopathic scoliosis, thoracolumbar region: Secondary | ICD-10-CM

## 2020-02-28 DIAGNOSIS — H9312 Tinnitus, left ear: Secondary | ICD-10-CM

## 2020-02-28 DIAGNOSIS — R262 Difficulty in walking, not elsewhere classified: Secondary | ICD-10-CM

## 2020-02-28 MED ORDER — GADOBUTROL 1 MMOL/ML IV SOLN
6.0000 mL | Freq: Once | INTRAVENOUS | Status: AC | PRN
  Administered 2020-02-28: 6 mL via INTRAVENOUS

## 2020-02-28 NOTE — Patient Instructions (Signed)
Pelvic Tilt With Pelvic Floor (Hook-Lying)        .  Lie with hips and knees bent. Breathe in a small breath and then EXHALE THROUGH PURSED LIPS and feel the low tummy muscles draw in (like saran wrap drawing across the lower abdomen) and flatten low back while breathing out so that pelvis tilts. Repeat _10x2__ times. Do _1-2__ times a day.  "Pre-squeeze and sneeze"  Before you cough, sneeze, laugh etc. "pre-squeeze" the pelvic floor (kegel) and hold it until you finish coughing to retrain the muscles to hold the urine in during these activities.       GENTLY use your top hand to put a little pressure on the wrist of the bottom arm and take nice long deep belly breaths for ~ 3 min. Per night.

## 2020-02-28 NOTE — Therapy (Signed)
[place] MAIN [facility] SERVICES [address] [place], [place], [address] Phone: [phone]   Fax:  [phone]  Physical Therapy Treatment  The patient has been informed of current processes in place at Outpatient Rehab to protect patients from Covid-19 exposure including social distancing, schedule modifications, and new cleaning procedures. After discussing their particular risk with a therapist based on the patient's personal risk factors, the patient has decided to proceed with in-person therapy.  Patient Details  Name: Brittany Browning MRN: [id number] Date of Birth: 25-Oct-1940 No data recorded  Encounter Date: 02/28/2020   PT End of Session - 02/29/20 0800    Visit Number 8    Number of Visits 20    Date for PT Re-Evaluation 05/22/20    Authorization Type medicare/BCBS    Authorization Time Period 01/03/20 through 05/22/20    Authorization - Visit Number 8    Authorization - Number of Visits 20    Progress Note Due on Visit 10    PT Start Time 1030    PT Stop Time 1130    PT Time Calculation (min) 60 min    Activity Tolerance Patient tolerated treatment well    Behavior During Therapy WFL for tasks assessed/performed           Past Medical History:  Diagnosis Date   Anemia    Anxiety    Arthritis    osteoarthritis   Basal cell carcinoma (BCC)    Cataract    Complication of anesthesia    has had problems in the past. can tolerate propofol   Environmental allergies    Insomnia    Osteopenia    Restless leg syndrome    Stress incontinence    Willis-Ekbom syndrome     Past Surgical History:  Procedure Laterality Date   CATARACT EXTRACTION W/PHACO Left 10/20/2017   Procedure: CATARACT EXTRACTION PHACO AND INTRAOCULAR LENS PLACEMENT ([place]);  Surgeon: Birder Robson, MD;  Location: [facility] ORS;  Service: Ophthalmology;  Laterality: Left;  [name] 00:39.7 AP% 12.9 CDE 5.09 Fluid Pack Lot # [id number]   COLONOSCOPY      SKIN CANCER EXCISION     TONSILLECTOMY AND ADENOIDECTOMY  1944    There were no vitals filed for this visit.    Pelvic Floor Physical Therapy Treatment Note  SCREENING  Changes in medications, allergies, or medical history?: none   SUBJECTIVE  Patient reports: Was under a lot of stress over the last week, having to renovate and sell her beach home and clean out 3 different storage units on her own. She has tried to do some of her exercises Has done exercises about 2-3 times over the week. Her legs were bothering her but she is not having sciatica or foot pain. Not sure if leakage has changed. Still leaks when sneezing hard. Balance is about the same, has been very deliberate with all of her motions. Her right shoulder is really bothering her and has made it hard to sleep.  Precautions:  restless leg syndrome, scoliosis, anxiety, osteopenia, insomnia and a fall in August 2020 resulting in a L wrist fracture and L foot in a boot for months  Sexual activity/pain: None, but it has been a while. 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  Location of pain: L mid foot (sciatica on L) Current pain: 0/10 (0/10) Max pain: 0/10 (0/10) Least pain: 0/10 (0/10) Nature of pain:walking, "just hurt" (sharp)  **no increased pain following treatment.  Patient Goals: Decrease leakage, improve balance.  OBJECTIVE  Changes in: Posture/Observations:  L anterior rotation and L ilium high (Pt. Not wearing heel-lift for RLE)  R lumbar/L thoracic scoliosis. LLE long by 1 cm. (from prior session)   Range of Motion/Flexibilty:  Decreased hip EXT B (from previous note)  Strength/MMT:  LE MMT:   Pelvic Floor External Exam: (from 02/15/20) Introitus Appears: WNL Skin integrity: age related atrophy Palpation: TTP to B STP and L IC Cough: parodoxical Prolapse visible?: none Scar mobility: decreased/tight bands near introitus.  Internal Vaginal Exam: Strength (PERF): 2+/5. 10 sec. 2  times. Symmetry: assymetrical Palpation: TTP to R OI>coccygeus>posterior PR/PC. TO L Coccygeus>OI>anterior PR/PC and at posterior fourchette. Prolapse: none   Internal Rectal Exam: Strength (PERF): Symmetry: Palpation: Prolapse:    Abdominal:  Pt. Initially has difficulty recruiting TA with posterior pelvic tilt, she compensates with glutes.   **Following Psoas release Pt. Able to recruit but unable to practice much due to increased restless legs.  Palpation: TTP to B Psoas  Gait Analysis: Gait still improved from initial assessment but does not demonstrate any rotation with motion.  INTERVENTIONS THIS SESSION: Manual: Performed TP release to B Psoas to decrease spasm and pain and allow for improved balance of musculature for improved function and decreased symptoms.  Self-care: Reviewed and practiced pre-squeeze and sneeze to decreaese SUI by re-training reflex. Discussed her shoulder pain and encouraged Pt. To ask her Dr. If it is OK for her to use Voltaren topically for shoulder pain as she works on ROM and strengthening to relieve pain and improve body mechanics.  Therex:  Educated on and practiced posterior pelvic tilts to improve strength of muscles opposing tight musculature to allow reciprocal inhibition to improve balance of musculature surrounding the pelvis and improve overall posture for optimal musculature length-tension relationship and function. Educated on and practiced sleeper stretch for R shoulder to help improve shoulder ROM and work toward balance of musculature around R shoulder to decrease pain and allow Pt. Improved sleep for decreased Sx.  Total time:  95min.                             PT Short Term Goals - 01/03/20 1214      PT SHORT TERM GOAL #1   Title Patient will demonstrate improved pelvic alignment and balance of musculature surrounding the pelvis to facilitate decreased PFM spasms and decrease pelvic pain.    Baseline R  up-slip and L anterior rotation    Time 10    Period Weeks    Status New    Target Date 03/13/20      PT SHORT TERM GOAL #2   Title Pt. will describe resolution of dizziness with positional changes to decrease risk of fall and increase ability to participate in HEP safely.    Baseline Pt. describes dizziness with turning her head and positional changes.    Time 5    Period Weeks    Status New    Target Date 02/07/20      PT SHORT TERM GOAL #3   Title Patient will demonstrate HEP x1 in the clinic to demonstrate understanding and proper form to allow for further improvement.    Baseline Pt. lacks knowledge of therapeutic exercises that will help decrease her SUI and increase her balance.    Time 5    Period Weeks    Status New    Target Date 02/07/20      PT SHORT TERM  GOAL #4   Title Pt. will be able to walk to and from waiting room without using furniture or walls for support to demonstrate increased confindence in her balance.    Baseline Pt. not using an appropriate AD, participating in furniture walking due to decreased confidence in her balance    Time 5    Period Weeks    Status New    Target Date 02/07/20      PT SHORT TERM GOAL #5   Title Patient will demonstrate a coordinated contraction, relaxation, and bulge of the pelvic floor muscles to demonstrate functional recruitment and motion and allow for further strengthening.    Baseline Pt. has breathing dysfunction and SUI indicating poor PFM coordination    Time 10    Period Weeks    Status New    Target Date 03/13/20             PT Long Term Goals - 01/27/20 1302      PT LONG TERM GOAL #1   Title Patient will report no episodes of SUI over the course of the prior two weeks to demonstrate improved functional ability.    Baseline SUI with activity and cough/sneeze/etc. using 4 incontinence pads per day.    Time 20    Period Weeks    Status New    Target Date 05/22/20      PT LONG TERM GOAL #2   Title  Patient will demonstrate 4+/5 strength or better in the PF and surrounding musculature to show improved functional strength for improved balance with exercise and other vocational and recreational activities.    Baseline Pt. describes feeling of weakness and demonstrates decreased balance, MMT to be established at follow-up    Time 20    Period Weeks    Status New      PT LONG TERM GOAL #3   Title Pt. will improve in FOTO score by 10 points to demonstrate improved function.    Baseline FOTO PFDI Urinary: 17, Urinary Problem: 41    Time 10    Period Weeks    Status New      PT LONG TERM GOAL #4   Title Pt. will be able to participate in light to moderate level exercise activity without increased pain or leakage to allow for long-term improved mental and physical well-being.    Baseline Pt. is frustrated that she cannot do her yoga classes due to her Sx.    Time 20    Period Weeks    Status New      PT LONG TERM GOAL #5   Title Patient will have improved balance as evidenced by a score of 20/24 or better on the DGI.    Baseline scored 15/24 on 01/24/2020    Time 20    Period Weeks    Status New    Target Date 05/22/20      Additional Long Term Goals   Additional Long Term Goals Yes      PT LONG TERM GOAL #6   Title Patient will reduce falls risk as indicated by Activities Specific Balance Confidence Scale (ABC) >67%.    Baseline scored 31% on 01/24/2020    Time 20    Period Weeks    Status New    Target Date 05/22/20                 Plan - 02/29/20 0904    Clinical Impression Statement Pt. Responded well to all interventions today,  demonstrating decreased TTP and spasm and improved deep-core recruitment and coordination, as well as understanding and correct performance of all education and exercises provided today. They will continue to benefit from skilled physical therapy to work toward remaining goals and maximize function as well as decrease likelihood of symptom  increase or recurrence.    PT Next Visit Plan Further PFM releaes and strengthening/coordination, Give bow-and-arrow and weight-shifts/balance, assess hip-strength and give HEP    PT Home Exercise Plan thomas-test hip-flexor stretch with hold-relax lengthening, side-stretch, VORx1, and Diaphragmatic breathing, child's pose stretch and pre-squeeze and sneeze".    Consulted and Agree with Plan of Care Patient           Patient will benefit from skilled therapeutic intervention in order to improve the following deficits and impairments:     Visit Diagnosis: Other idiopathic scoliosis, thoracolumbar region  Other muscle spasm  Difficulty in walking, not elsewhere classified  Dizziness and giddiness     Problem List Patient Active Problem List   Diagnosis Date Noted   Venous reflux 08/05/2017   Obstructive sleep apnea 07/17/2017   B12 deficiency 07/16/2017   Polypharmacy 07/16/2017   Depression 07/15/2017   Allergic rhinitis 06/24/2017   Anxiety 06/24/2017   Osteoarthritis 06/23/2017   Atypical chest pain 06/23/2017   Chronic back pain 06/23/2017   Female stress incontinence 06/23/2017   Hypercholesterolemia 06/23/2017   Osteoporosis 06/23/2017   Situational stress 06/23/2017   Vitamin D deficiency 06/23/2017   Gait instability 06/23/2017   RLS (restless legs syndrome) 12/31/2016   Insomnia 12/31/2016   Urinary incontinence 12/31/2016   Arthralgia 12/31/2016   DNAR (do not attempt resuscitation) 05/30/2014   Cataract 06/18/2012   Dizziness 03/02/2012   Referred otalgia 03/02/2012   Willa Rough DPT, ATC Willa Rough 02/29/2020, 10:59 AM  [place] MAIN [facility] SERVICES [address] [place], [place], [address] Phone: [phone]   Fax:  [phone]  Name: Brittany Browning MRN: [id number] Date of Birth: 1941-08-02

## 2020-03-06 ENCOUNTER — Other Ambulatory Visit: Payer: Self-pay

## 2020-03-06 ENCOUNTER — Ambulatory Visit: Payer: Medicare Other | Attending: Family Medicine

## 2020-03-06 DIAGNOSIS — R42 Dizziness and giddiness: Secondary | ICD-10-CM | POA: Insufficient documentation

## 2020-03-06 DIAGNOSIS — M62838 Other muscle spasm: Secondary | ICD-10-CM | POA: Insufficient documentation

## 2020-03-06 DIAGNOSIS — M4125 Other idiopathic scoliosis, thoracolumbar region: Secondary | ICD-10-CM | POA: Diagnosis not present

## 2020-03-06 DIAGNOSIS — R262 Difficulty in walking, not elsewhere classified: Secondary | ICD-10-CM | POA: Diagnosis present

## 2020-03-06 NOTE — Therapy (Signed)
[place] MAIN [facility] SERVICES [address] [place], [place], [address] Phone: [phone]   Fax:  [phone]  Physical Therapy Treatment  The patient has been informed of current processes in place at Outpatient Rehab to protect patients from Covid-19 exposure including social distancing, schedule modifications, and new cleaning procedures. After discussing their particular risk with a therapist based on the patient's personal risk factors, the patient has decided to proceed with in-person therapy.  Patient Details  Name: Brittany Browning MRN: [id number] Date of Birth: 09/03/1940 No data recorded  Encounter Date: 03/06/2020   PT End of Session - 03/06/20 1137    Visit Number 9    Number of Visits 20    Date for PT Re-Evaluation 05/22/20    Authorization Type medicare/BCBS    Authorization Time Period 01/03/20 through 05/22/20    Authorization - Visit Number 9    Authorization - Number of Visits 20    Progress Note Due on Visit 10    PT Start Time 1030    PT Stop Time 1130    PT Time Calculation (min) 60 min    Activity Tolerance Patient tolerated treatment well;No increased pain    Behavior During Therapy WFL for tasks assessed/performed           Past Medical History:  Diagnosis Date  . Anemia   . Anxiety   . Arthritis    osteoarthritis  . Basal cell carcinoma (BCC)   . Cataract   . Complication of anesthesia    has had problems in the past. can tolerate propofol  . Environmental allergies   . Insomnia   . Osteopenia   . Restless leg syndrome   . Stress incontinence   . Willis-Ekbom syndrome     Past Surgical History:  Procedure Laterality Date  . CATARACT EXTRACTION W/PHACO Left 10/20/2017   Procedure: CATARACT EXTRACTION PHACO AND INTRAOCULAR LENS PLACEMENT ([facility]);  Surgeon: Birder Robson, MD;  Location: [facility] ORS;  Service: Ophthalmology;  Laterality: Left;  [name] 00:39.7 AP% 12.9 CDE 5.09 Fluid Pack Lot # [id number]  .  COLONOSCOPY    . SKIN CANCER EXCISION    . TONSILLECTOMY AND ADENOIDECTOMY  1944    There were no vitals filed for this visit.    Pelvic Floor Physical Therapy Treatment Note  SCREENING  Changes in medications, allergies, or medical history?: none   SUBJECTIVE  Patient reports: MRI was OK. Had a really bad night of RLS two nights ago, still having really bad/high stress. "achy  From arthritis but no pain.  Precautions:  restless leg syndrome, scoliosis, anxiety, osteopenia, insomnia and a fall in August 2020 resulting in a L wrist fracture and L foot in a boot for months  Sexual activity/pain: None, but it has been a while. 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  Location of pain: L mid foot (sciatica on L) Current pain: 0/10 (0/10) Max pain: 0/10 (0/10) Least pain: 0/10 (0/10) Nature of pain:walking, "just hurt" (sharp)  **no increased pain following treatment.  Patient Goals: Decrease leakage, improve balance.   OBJECTIVE  Changes in: Posture/Observations:  PSIS level with heel-lift in place.   Range of Motion/Flexibilty:  Decreased hip EXT B (from previous note)  Strength/MMT:  LE MMT:   Pelvic Floor External Exam: (from 02/15/20) Introitus Appears: WNL Skin integrity: age related atrophy Palpation: TTP to B STP and L IC Cough: parodoxical Prolapse visible?: none Scar mobility: decreased/tight bands near introitus.  Internal Vaginal Exam: Strength (PERF):  2+/5. 10 sec. 2 times. Symmetry: assymetrical Palpation: TTP to R OI>coccygeus>posterior PR/PC. TO L Coccygeus>OI>anterior PR/PC and at posterior fourchette. Prolapse: none   Abdominal:  Pt. Able to recruit deep-core appropriately after ~ 10 repetitions with Max to [place] with VC and TC. Still requires Min cet not to over-engage the glutes when recruiting the Obliques.  Palpation: TTP to B Psoas  Gait Analysis: Gait still improved from initial assessment but does not  demonstrate any rotation with motion. Pt. Getting limited L hip flexion with AMB. Able to correct with intentional effort/focus but requires practice.  INTERVENTIONS THIS SESSION: Manual: Performed TP release to L Psoas and Iliacus to decrease spasm and pain and allow for improved balance of musculature for improved function and decreased symptoms.  Self-care: Educated on potential of Reiki and Acupressure for helping decrease RLS. Reminded Pt of the importance of Iron and Magnesium in decreasing Sx. Asked about how previous recommendations went but Pt. Has yet to attempt using lotion or relaxation gummies due to high stress.   Therex:  Reviewed hip-flexor stretch and used hold-relax to allow for greatest motion gain. Educated on and practiced standing Single leg balance with hands on a chair for 10 sec. Educated on and practiced heel-toe walking with emphasis on picking up the knee/flexing hip first to prevent foot drag causing a fall and to improve control and balance around the hip..   Total time:  74min.                                PT Short Term Goals - 01/03/20 1214      PT SHORT TERM GOAL #1   Title Patient will demonstrate improved pelvic alignment and balance of musculature surrounding the pelvis to facilitate decreased PFM spasms and decrease pelvic pain.    Baseline R up-slip and L anterior rotation    Time 10    Period Weeks    Status New    Target Date 03/13/20      PT SHORT TERM GOAL #2   Title Pt. will describe resolution of dizziness with positional changes to decrease risk of fall and increase ability to participate in HEP safely.    Baseline Pt. describes dizziness with turning her head and positional changes.    Time 5    Period Weeks    Status New    Target Date 02/07/20      PT SHORT TERM GOAL #3   Title Patient will demonstrate HEP x1 in the clinic to demonstrate understanding and proper form to allow for further improvement.     Baseline Pt. lacks knowledge of therapeutic exercises that will help decrease her SUI and increase her balance.    Time 5    Period Weeks    Status New    Target Date 02/07/20      PT SHORT TERM GOAL #4   Title Pt. will be able to walk to and from waiting room without using furniture or walls for support to demonstrate increased confindence in her balance.    Baseline Pt. not using an appropriate AD, participating in furniture walking due to decreased confidence in her balance    Time 5    Period Weeks    Status New    Target Date 02/07/20      PT SHORT TERM GOAL #5   Title Patient will demonstrate a coordinated contraction, relaxation, and bulge of the pelvic floor  muscles to demonstrate functional recruitment and motion and allow for further strengthening.    Baseline Pt. has breathing dysfunction and SUI indicating poor PFM coordination    Time 10    Period Weeks    Status New    Target Date 03/13/20             PT Long Term Goals - 01/27/20 1302      PT LONG TERM GOAL #1   Title Patient will report no episodes of SUI over the course of the prior two weeks to demonstrate improved functional ability.    Baseline SUI with activity and cough/sneeze/etc. using 4 incontinence pads per day.    Time 20    Period Weeks    Status New    Target Date 05/22/20      PT LONG TERM GOAL #2   Title Patient will demonstrate 4+/5 strength or better in the PF and surrounding musculature to show improved functional strength for improved balance with exercise and other vocational and recreational activities.    Baseline Pt. describes feeling of weakness and demonstrates decreased balance, MMT to be established at follow-up    Time 20    Period Weeks    Status New      PT LONG TERM GOAL #3   Title Pt. will improve in FOTO score by 10 points to demonstrate improved function.    Baseline FOTO PFDI Urinary: 17, Urinary Problem: 41    Time 10    Period Weeks    Status New      PT LONG TERM  GOAL #4   Title Pt. will be able to participate in light to moderate level exercise activity without increased pain or leakage to allow for long-term improved mental and physical well-being.    Baseline Pt. is frustrated that she cannot do her yoga classes due to her Sx.    Time 20    Period Weeks    Status New      PT LONG TERM GOAL #5   Title Patient will have improved balance as evidenced by a score of 20/24 or better on the DGI.    Baseline scored 15/24 on 01/24/2020    Time 20    Period Weeks    Status New    Target Date 05/22/20      Additional Long Term Goals   Additional Long Term Goals Yes      PT LONG TERM GOAL #6   Title Patient will reduce falls risk as indicated by Activities Specific Balance Confidence Scale (ABC) >67%.    Baseline scored 31% on 01/24/2020    Time 20    Period Weeks    Status New    Target Date 05/22/20                 Plan - 03/06/20 1137    Clinical Impression Statement Pt. Responded well to all interventions today, demonstrating improved deep-core control and gait mechanics, as well as understanding and correct performance of all education and exercises provided today. They will continue to benefit from skilled physical therapy to work toward remaining goals and maximize function as well as decrease likelihood of symptom increase or recurrence.    PT Next Visit Plan Further PFM releaes and strengthening/coordination, Give bow-and-arrow and lumbar rotations, assess hip-strength and give HEP    PT Home Exercise Plan thomas-test hip-flexor stretch with hold-relax lengthening, side-stretch, VORx1, and Diaphragmatic breathing, child's pose stretch and pre-squeeze and sneeze", posterior pelvic tilts,  standing balance and heel-toe walking.    Consulted and Agree with Plan of Care Patient           Patient will benefit from skilled therapeutic intervention in order to improve the following deficits and impairments:     Visit Diagnosis: Other  idiopathic scoliosis, thoracolumbar region  Other muscle spasm  Difficulty in walking, not elsewhere classified  Dizziness and giddiness     Problem List Patient Active Problem List   Diagnosis Date Noted  . Venous reflux 08/05/2017  . Obstructive sleep apnea 07/17/2017  . B12 deficiency 07/16/2017  . Polypharmacy 07/16/2017  . Depression 07/15/2017  . Allergic rhinitis 06/24/2017  . Anxiety 06/24/2017  . Osteoarthritis 06/23/2017  . Atypical chest pain 06/23/2017  . Chronic back pain 06/23/2017  . Female stress incontinence 06/23/2017  . Hypercholesterolemia 06/23/2017  . Osteoporosis 06/23/2017  . Situational stress 06/23/2017  . Vitamin D deficiency 06/23/2017  . Gait instability 06/23/2017  . RLS (restless legs syndrome) 12/31/2016  . Insomnia 12/31/2016  . Urinary incontinence 12/31/2016  . Arthralgia 12/31/2016  . DNAR (do not attempt resuscitation) 05/30/2014  . Cataract 06/18/2012  . Dizziness 03/02/2012  . Referred otalgia 03/02/2012   Willa Rough DPT, ATC Willa Rough 03/06/2020, 11:47 AM  [place] MAIN [facility] SERVICES [address] [place], [place], [address] Phone: [phone]   Fax:  [phone]  Name: Brittany Browning MRN: [id number] Date of Birth: 23-Jan-1941

## 2020-03-06 NOTE — Patient Instructions (Signed)
  Stand with your weight on one leg and pick the opposite leg up. Hold for as long as you can keep your hips level (~ 10 sec.). Do 5 on each side.  Practice walking picking your knee up and then walking "heel-toe". Do this for at least 5 min. Per day near a handrail.

## 2020-03-13 ENCOUNTER — Ambulatory Visit: Payer: Medicare Other

## 2020-03-20 ENCOUNTER — Ambulatory Visit: Payer: Medicare Other

## 2020-03-20 ENCOUNTER — Other Ambulatory Visit: Payer: Self-pay

## 2020-03-20 DIAGNOSIS — M4125 Other idiopathic scoliosis, thoracolumbar region: Secondary | ICD-10-CM | POA: Diagnosis not present

## 2020-03-20 DIAGNOSIS — R42 Dizziness and giddiness: Secondary | ICD-10-CM

## 2020-03-20 DIAGNOSIS — M62838 Other muscle spasm: Secondary | ICD-10-CM

## 2020-03-20 DIAGNOSIS — R262 Difficulty in walking, not elsewhere classified: Secondary | ICD-10-CM

## 2020-03-20 NOTE — Therapy (Addendum)
[place] MAIN [facility] SERVICES [address] [place], [place], [address] Phone: [phone]   Fax:  [phone]  Physical Therapy Progress Note   Dates of reporting period  01/03/20   to   03/20/20   The patient has been informed of current processes in place at [facility]patient [facility] to protect patients from Covid-19 exposure including social distancing, schedule modifications, and new cleaning procedures. After discussing their particular risk with a therapist based on the patient's personal risk factors, the patient has decided to proceed with in-person therapy.   Patient Details  Name: Brittany Browning MRN: [id number] Date of Birth: 1940-11-11 No data recorded  Encounter Date: 03/20/2020   PT End of Session - 03/20/20 1143    Visit Number 10    Number of Visits 20    Date for PT Re-Evaluation 05/22/20    Authorization Type medicare/BCBS    Authorization Time Period 01/03/20 through 05/22/20    Authorization - Visit Number 10    Authorization - Number of Visits 20    Progress Note Due on Visit 10    PT Start Time 1030    PT Stop Time 1130    PT Time Calculation (min) 60 min    Activity Tolerance Patient tolerated treatment well;No increased pain    Behavior During Therapy WFL for tasks assessed/performed           Past Medical History:  Diagnosis Date  . Anemia   . Anxiety   . Arthritis    osteoarthritis  . Basal cell carcinoma (BCC)   . Cataract   . Complication of anesthesia    has had problems in the past. can tolerate propofol  . Environmental allergies   . Insomnia   . Osteopenia   . Restless leg syndrome   . Stress incontinence   . Willis-Ekbom syndrome     Past Surgical History:  Procedure Laterality Date  . CATARACT EXTRACTION W/PHACO Left 10/20/2017   Procedure: CATARACT EXTRACTION PHACO AND INTRAOCULAR LENS PLACEMENT (IOC);  Surgeon: Birder Robson, MD;  Location: [facility] ORS;  Service: Ophthalmology;  Laterality:  Left;  [name] 00:39.7 AP% 12.9 CDE 5.09 Fluid Pack Lot # [id number]  . COLONOSCOPY    . SKIN CANCER EXCISION    . TONSILLECTOMY AND ADENOIDECTOMY  1944    There were no vitals filed for this visit.    Pelvic Floor Physical Therapy Treatment Note  SCREENING  Changes in medications, allergies, or medical history?: none   SUBJECTIVE  Patient reports: She has been doing her exercises and feels like she is doing better now when she is "picking the blueberries". She is drinking more and she got another heel-lift. She has been having a lot of sneezing and coughing with allergies causing leakage. Her right leg is worse than the left. Her balance is a lot better, she is able to walk in a straight line unless she tries to walk with her feet in tandem stance.  Precautions:  restless leg syndrome, scoliosis, anxiety, osteopenia, insomnia and a fall in August 2020 resulting in a L wrist fracture and L foot in a boot for months  Sexual activity/pain: None, but it has been a while. 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  Location of pain: L mid foot (sciatica on L) Current pain: 0/10 (0/10) Max pain: 0/10 (0/10) Least pain: 0/10 (0/10) Nature of pain:walking, "just hurt" (sharp)  **no increased pain following treatment.  Patient Goals: Decrease leakage, improve balance.   OBJECTIVE  Changes in: Posture/Observations:  PSIS and ASIS level with heel-lift in place.    Range of Motion/Flexibilty:  Decreased hip EXT B (from previous note)  Strength/MMT:  LE MMT (initial 03/20/20) LE MMT Left Right  Hip flex:  (L2) /5 /5  Hip ext: 4+/5 4+/5  Hip abd: 4-/5 4+/5  Hip add: 4/5 5/5  Hip IR 3+/5 4/5  Hip ER 4-/5 4/5   Pelvic Floor External Exam: (from 02/15/20) Introitus Appears: WNL Skin integrity: age related atrophy Palpation: TTP to B STP and L IC Cough: parodoxical Prolapse visible?: none Scar mobility: decreased/tight bands near introitus.  Internal Vaginal  Exam: Strength (PERF): 2+/5. 10 sec. 2 times. Symmetry: assymetrical Palpation: TTP to R OI>coccygeus>posterior PR/PC. TO L Coccygeus>OI>anterior PR/PC and at posterior fourchette. Prolapse: none   Abdominal:  Pt. Able to recruit deep-core appropriately after ~ 10 repetitions with Max to [place] with VC and TC. Still requires Min cues not to over-engage the glutes when recruiting the Obliques. (from prior session)  Palpation: TTP to L gracilis with MMT.  Gait Analysis: Gait is ~ 80% improved from initial session, no sway noted, minimal trendelenburg, some decreased foot descent control with loading phase, decreased torso rotation/arm swing but ~ 25% improved from initial.  INTERVENTIONS THIS SESSION: Manual: Performed TP release to L gracilis to decrease spasm and pain and allow for improved balance of musculature for improved function and decreased symptoms. Assessed LE strength to allow for further POC development to further decrease fall risk and restless leg syndrome.  Self-care: Reviewed and updated goals and POC. Given a case-study to read about how using a multi-modal approach of acupuncture, massage, yoga, and exercise with CBD/alternative medicine helped a similar patient with RLS to help Pt. As she searches for a practitioner who can help guide her in the best combination for her.  Therex:  Educated on and practiced lumbar rotations and bow-and-arrow rotations to improve mobility of joint and surrounding connective tissue and decrease pressure on nerve roots for improved conductivity and function of down-stream tissues.   Total time:  30mn.                               PT Short Term Goals - 03/20/20 1051      PT SHORT TERM GOAL #1   Title Patient will demonstrate improved pelvic alignment and balance of musculature surrounding the pelvis to facilitate decreased PFM spasms and decrease pelvic pain.    Baseline R up-slip and L anterior rotation     Time 10    Period Weeks    Status Achieved    Target Date 03/13/20      PT SHORT TERM GOAL #2   Title Pt. will describe resolution of dizziness with positional changes to decrease risk of fall and increase ability to participate in HEP safely.    Baseline Pt. describes dizziness with turning her head and positional changes.    Time 5    Period Weeks    Status Achieved    Target Date 02/07/20      PT SHORT TERM GOAL #3   Title Patient will demonstrate HEP x1 in the clinic to demonstrate understanding and proper form to allow for further improvement.    Baseline Pt. lacks knowledge of therapeutic exercises that will help decrease her SUI and increase her balance.    Time 5    Period Weeks    Status Achieved  Target Date 02/07/20      PT SHORT TERM GOAL #4   Title Pt. will be able to walk to and from waiting room without using furniture or walls for support to demonstrate increased confindence in her balance.    Baseline Pt. not using an appropriate AD, participating in furniture walking due to decreased confidence in her balance    Time 5    Period Weeks    Status Achieved    Target Date 02/07/20      PT SHORT TERM GOAL #5   Title Patient will demonstrate a coordinated contraction, relaxation, and bulge of the pelvic floor muscles to demonstrate functional recruitment and motion and allow for further strengthening.    Baseline Pt. has breathing dysfunction and SUI indicating poor PFM coordination    Time 10    Period Weeks    Status Achieved    Target Date 03/13/20             PT Long Term Goals - 03/20/20 1057      PT LONG TERM GOAL #1   Title Patient will report no episodes of SUI over the course of the prior two weeks to demonstrate improved functional ability.    Baseline SUI with activity and cough/sneeze/etc. using 4 incontinence pads per day. As of 03/20/20: 2-3 pads per day    Time 20    Period Weeks    Status On-going    Target Date 05/22/20      PT LONG  TERM GOAL #2   Title Patient will demonstrate 4+/5 strength or better in the PF and surrounding musculature to show improved functional strength for improved balance with exercise and other vocational and recreational activities.    Baseline Pt. describes feeling of weakness and demonstrates decreased balance, MMT to be established at follow-up 03/20/20: MMT established today.    Time 20    Period Weeks    Status On-going    Target Date 05/22/20      PT LONG TERM GOAL #3   Title Pt. will improve in FOTO score by 10 points to demonstrate improved function.    Baseline FOTO PFDI Urinary: 17, Urinary Problem: 41 As of 03/20/20: FOTO PFDI Urinary: 0, Urinary Problem: 52 (17 pt. improvement, 11 pt. imptovement)    Time 10    Period Weeks    Status Achieved    Target Date 05/22/20      PT LONG TERM GOAL #4   Title Pt. will be able to participate in light to moderate level exercise activity without increased pain or leakage to allow for long-term improved mental and physical well-being.    Baseline Pt. is frustrated that she cannot do her yoga classes due to her Sx. As of 8/17: Pt. is able to do her yoga clases 1-2 times per week, stays close to a rail/wall for comfort/confidence.    Time 20    Period Weeks    Status Achieved    Target Date 05/22/20      PT LONG TERM GOAL #5   Title Patient will have improved balance as evidenced by a score of 20/24 or better on the DGI.    Baseline scored 15/24 on 01/24/2020    Time 20    Period Weeks    Status Unable to assess    Target Date 05/22/20      PT LONG TERM GOAL #6   Title Patient will reduce falls risk as indicated by Activities Specific Balance Confidence Scale (  ABC) >67%.    Baseline scored 31% on 01/24/2020    Time 20    Period Weeks    Status Unable to assess    Target Date 05/22/20                 Plan - 03/20/20 1154    Clinical Impression Statement Pt. has met all short-term goals and made significant progress toward her  long-term goals but continues to demonstrate greater RLS in the RLE, some balance and AMB difficulty, and weakness surrounding the hips and in the PFM leading to decreased but continued SUI. She will continue to benefit from skilled pelvic health PT for 10 more weeks at a 1x/week basis to address the remaining deficits and decrease Sx.    Personal Factors and Comorbidities Comorbidity 3+    Comorbidities restless leg syndrome, sciliosis, anxiety, osteopenia, insomnia and a fall in August 2020 resulting in a L wrist fracture and L foot in a boot for months    Examination-Activity Limitations Continence;Sleep;Locomotion Level;Bend;Transfers    Examination-Participation Restrictions Community Activity    Stability/Clinical Decision Making Unstable/Unpredictable    Clinical Decision Making High    Rehab Potential Good    PT Frequency 1x / week    PT Duration Other (comment)   10 weeks   PT Treatment/Interventions ADLs/Self Care Home Management;Biofeedback;Moist Heat;Electrical Stimulation;Traction;Ultrasound;Therapeutic activities;Functional mobility training;DME Instruction;Stair training;Gait training;Therapeutic exercise;Balance training;Neuromuscular re-education;Patient/family education;Orthotic Fit/Training;Manual techniques;Taping;Dry needling;Passive range of motion;Vestibular;Spinal Manipulations;Joint Manipulations    PT Next Visit Plan TPDN to R hip-flexors, Further PFM releaes and strengthening/coordination, review pelvic tilts/coordination with PFM, give hip-strengthening HEP    PT Home Exercise Plan thomas-test hip-flexor stretch with hold-relax lengthening, side-stretch, VORx1, and Diaphragmatic breathing, child's pose stretch and pre-squeeze and sneeze", posterior pelvic tilts, standing balance and heel-toe walking, bow-and-arrow, lumbar rotations.    Consulted and Agree with Plan of Care Patient           Patient will benefit from skilled therapeutic intervention in order to improve  the following deficits and impairments:  Abnormal gait, Decreased balance, Decreased endurance, Difficulty walking, Increased muscle spasms, Decreased range of motion, Dizziness, Improper body mechanics, Impaired tone, Decreased coordination, Decreased strength, Increased fascial restricitons, Impaired flexibility, Postural dysfunction, Pain  Visit Diagnosis: Other idiopathic scoliosis, thoracolumbar region  Other muscle spasm  Difficulty in walking, not elsewhere classified  Dizziness and giddiness     Problem List Patient Active Problem List   Diagnosis Date Noted  . Venous reflux 08/05/2017  . Obstructive sleep apnea 07/17/2017  . B12 deficiency 07/16/2017  . Polypharmacy 07/16/2017  . Depression 07/15/2017  . Allergic rhinitis 06/24/2017  . Anxiety 06/24/2017  . Osteoarthritis 06/23/2017  . Atypical chest pain 06/23/2017  . Chronic back pain 06/23/2017  . Female stress incontinence 06/23/2017  . Hypercholesterolemia 06/23/2017  . Osteoporosis 06/23/2017  . Situational stress 06/23/2017  . Vitamin D deficiency 06/23/2017  . Gait instability 06/23/2017  . RLS (restless legs syndrome) 12/31/2016  . Insomnia 12/31/2016  . Urinary incontinence 12/31/2016  . Arthralgia 12/31/2016  . DNAR (do not attempt resuscitation) 05/30/2014  . Cataract 06/18/2012  . Dizziness 03/02/2012  . Referred otalgia 03/02/2012   Willa Rough DPT, ATC Willa Rough 03/20/2020, 12:07 PM  [place] MAIN [facility] SERVICES [address] [place], [place], [address] Phone: [phone]   Fax:  [phone]  Name: Brittany Browning MRN: [id number] Date of Birth: 1940/08/21

## 2020-03-20 NOTE — Addendum Note (Signed)
Addended by: Letitia Libra T on: 03/20/2020 12:09 PM   Modules accepted: Orders

## 2020-03-20 NOTE — Patient Instructions (Addendum)
° °  Let the top arm rest on your side, and slide along the torso as you rotate. Breathe in as you come forward, out as you open up. Do 2x15 on each side.   Inhale to the side and exhale to the center. Do first-thing in the morning ~ 2x10 or until no discomfort.

## 2020-04-06 ENCOUNTER — Ambulatory Visit: Payer: Medicare Other

## 2020-04-13 ENCOUNTER — Ambulatory Visit: Payer: Medicare Other

## 2020-04-20 ENCOUNTER — Ambulatory Visit: Payer: Medicare Other

## 2020-04-27 ENCOUNTER — Ambulatory Visit: Payer: Medicare Other

## 2020-05-25 ENCOUNTER — Other Ambulatory Visit: Payer: Self-pay | Admitting: Podiatry

## 2020-05-25 ENCOUNTER — Ambulatory Visit (INDEPENDENT_AMBULATORY_CARE_PROVIDER_SITE_OTHER): Payer: Medicare Other | Admitting: Podiatry

## 2020-05-25 ENCOUNTER — Ambulatory Visit (INDEPENDENT_AMBULATORY_CARE_PROVIDER_SITE_OTHER): Payer: Medicare Other

## 2020-05-25 ENCOUNTER — Other Ambulatory Visit: Payer: Self-pay

## 2020-05-25 DIAGNOSIS — M722 Plantar fascial fibromatosis: Secondary | ICD-10-CM

## 2020-05-25 DIAGNOSIS — M7661 Achilles tendinitis, right leg: Secondary | ICD-10-CM

## 2020-05-25 MED ORDER — MELOXICAM 15 MG PO TABS: 15.0000 mg | ORAL_TABLET | Freq: Every day | ORAL | 0 refills | Status: DC

## 2020-05-25 NOTE — Progress Notes (Signed)
   HPI: 79 y.o. female presenting today as an established patient new complaint regarding right posterior ankle pain.  Patient states the pain has been going on for approximately 1 month.  At that time she was given a heel lift and some orthotics to wear from her chiropractor.  She believes that is when the pain began.  Currently she has not done anything for treatment.  She presents for further treatment and evaluation  Past Medical History:  Diagnosis Date  . Anemia   . Anxiety   . Arthritis    osteoarthritis  . Basal cell carcinoma (BCC)   . Cataract   . Complication of anesthesia    has had problems in the past. can tolerate propofol  . Environmental allergies   . Insomnia   . Osteopenia   . Restless leg syndrome   . Stress incontinence   . Willis-Ekbom syndrome      Physical Exam: General: The patient is alert and oriented x3 in no acute distress.  Dermatology: Skin is warm, dry and supple bilateral lower extremities. Negative for open lesions or macerations.  Vascular: Palpable pedal pulses bilaterally. No edema or erythema noted. Capillary refill within normal limits.  Neurological: Epicritic and protective threshold grossly intact bilaterally.   Musculoskeletal Exam: Range of motion within normal limits to all pedal and ankle joints bilateral. Muscle strength 5/5 in all groups bilateral.  Pain on palpation along the mid substance of the Achilles tendon right lower extremity  Radiographic Exam:  Normal osseous mineralization. Joint spaces preserved. No fracture/dislocation/boney destruction.    Assessment: 1.  Achilles tendinitis right   Plan of Care:  1. Patient evaluated. X-Rays reviewed.  2.  Injection of 0.5 cc Celestone Soluspan injected along the Achilles tendon sheath right lower extremity.  Care was taken to avoid direct injection into the Achilles tendon 3.  Ankle brace dispensed.  Wear daily x3 weeks 4.  Prescription for meloxicam 15 mg daily x3 weeks 5.   Return to clinic in 3 weeks      Edrick Kins, DPM [facility]  Dr. Edrick Kins, DPM    [address], [place] [address]                Office [phone]  Fax [phone]

## 2020-05-28 ENCOUNTER — Other Ambulatory Visit: Payer: Self-pay

## 2020-05-28 ENCOUNTER — Ambulatory Visit: Payer: Medicare Other | Attending: Family Medicine

## 2020-05-28 DIAGNOSIS — R262 Difficulty in walking, not elsewhere classified: Secondary | ICD-10-CM | POA: Insufficient documentation

## 2020-05-28 DIAGNOSIS — R42 Dizziness and giddiness: Secondary | ICD-10-CM | POA: Diagnosis present

## 2020-05-28 DIAGNOSIS — M4125 Other idiopathic scoliosis, thoracolumbar region: Secondary | ICD-10-CM

## 2020-05-28 DIAGNOSIS — M62838 Other muscle spasm: Secondary | ICD-10-CM | POA: Insufficient documentation

## 2020-05-28 NOTE — Patient Instructions (Signed)
  Hold for 5 deep breaths and repeat 2-3 times with the knee straight and bent on each.   Hold for 5 deep breaths using the weight of your other hand to apply gentle pressure. Hold for 5 breaths and repeat 2-3 times on your right side (left if desired)

## 2020-05-28 NOTE — Therapy (Signed)
[place] MAIN [facility] SERVICES [address] [place], [place], [address] Phone: [phone]   Fax:  [phone]  Physical Therapy Evaluation  The patient has been informed of current processes in place at Outpatient Rehab to protect patients from Covid-19 exposure including social distancing, schedule modifications, and new cleaning procedures. After discussing their particular risk with a therapist based on the patient's personal risk factors, the patient has decided to proceed with in-person therapy.  Patient Details  Name: Brittany Browning MRN: [id number] Date of Birth: 11/25/1940 No data recorded  Encounter Date: 05/28/2020   PT End of Session - 05/29/20 0843    Visit Number 1    Number of Visits 20    Date for PT Re-Evaluation 08/07/20    Authorization Type medicare/BCBS    Authorization Time Period 05/28/20 through 08/08/19    Authorization - Visit Number 1    Authorization - Number of Visits 10    Progress Note Due on Visit 10    PT Start Time 6789    PT Stop Time 1340    PT Time Calculation (min) 70 min    Activity Tolerance Patient tolerated treatment well    Behavior During Therapy WFL for tasks assessed/performed           Past Medical History:  Diagnosis Date   Anemia    Anxiety    Arthritis    osteoarthritis   Basal cell carcinoma (BCC)    Cataract    Complication of anesthesia    has had problems in the past. can tolerate propofol   Environmental allergies    Insomnia    Osteopenia    Restless leg syndrome    Stress incontinence    Willis-Ekbom syndrome     Past Surgical History:  Procedure Laterality Date   CATARACT EXTRACTION W/PHACO Left 10/20/2017   Procedure: CATARACT EXTRACTION PHACO AND INTRAOCULAR LENS PLACEMENT ([place]);  Surgeon: Birder Robson, MD;  Location: [facility] ORS;  Service: Ophthalmology;  Laterality: Left;  [name] 00:39.7 AP% 12.9 CDE 5.09 Fluid Pack Lot # [id number]   COLONOSCOPY      SKIN CANCER EXCISION     TONSILLECTOMY AND ADENOIDECTOMY  1944    There were no vitals filed for this visit.   Pelvic Floor Physical Therapy Evaluation and Assessment  SCREENING  Falls in last 6 mo: no   Red Flags:  Have you had any night sweats? yes Unexplained weight loss? none Saddle anesthesia? none Unexplained changes in bowel or bladder habits? none  SUBJECTIVE  Patient reports: She has injured her achilles tendon on the R side, the podiatrist told her to stop using her heel lift but she is worried that is going to put her out of balance again. She has a hot-pack for her shoulder that she wants to know if she should use. Her husband passed away and she is still planning the arrangements for his Zoom celebration of life with will be November 12th. Her shoulder is really bothering her and she thinks it may be in part the way she is sleeping, especially when she was in [place] because she was told to sleep on her side when she is not using oxygen for sleep. She feels like the sleep-test to see if her oxygen levels were high enough when she was sleeping may not be accurate because it took to long to process and it may have just gotten lost somewhere and they had to come up with something.    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  Recent Procedures/Tests/Findings:  1. Multilevel cervical spine degenerative disc and facet changes. No  significant central canal stenosis in the lumbar spine.  2. Multilevel neural foraminal stenosis. There is moderate right-sided  neural foraminal stenosis at L2-L3 and moderate left-sided neural foraminal  stenosis at L5-S1. Laterally bulging disc osteophyte complex at L5-S1 abuts  the left L5 nerve root as it exits the left  neural foramen. From note of Dr Olive Bass 07/06/19  Obstetrical History: No children  Gynecological History: none  Urinary History: Started having leakage in 1989, has leakage mostly with activity and with cough/sneeze.  Nocturiax 2-3,  urinates ~ every 2-3 hours throughout the day.  Drinks "a lot of water" throughout the day  Maybe not quite 64 oz and sometimes flavored waters, etc.  Gastrointestinal History: Drinks prune juice and takes magnesium to stay regular, would be constipated otherwise from the opioid she takes for her restless leg syndrome.  Sexual activity/pain: None, but it has been a while. 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  Location of pain: R achilles tendon Current pain: 5/10  Max pain: 7/10 Least pain: 3/10 Nature of pain:  **following treatment her heel hurt worse.  Location of pain: R shoulder Current pain: 7/10  Max pain: 9/10 Least pain: 5/10 Nature of pain:  **following treatment R shoulder felt "better" 5/10.   Patient Goals: Not have pain in her shoulder or ankle, Decrease leakage, improve balance.   OBJECTIVE  Posture/Observations:  Sitting: restless, frequent fidgeting Standing:   Palpation/Segmental Motion/Joint Play: Pt. Demonstrated decreased [place] joint play in all directions on R but demonstrated a self manipulation while resisting shoulder adduction that increased PROM by ~ 50% in each direction with new end-feel being an empty one and pain located in the sub acromial space.  Special tests:   Scoliosis: R lumbar/L thoracic scoliosis  Range of Motion/Flexibilty:  Spine: Hips: Shoulder; R Flex: ~80 deg. R ABD: ~70 deg. (shrugging at [facility] joint for further "false ROM"  DF: R-~ 15 deg. L- ~ 5 deg.  Strength/MMT: LE MMT (initial 03/20/20) LE MMT Left Right  Hip flex: (L2) /5 /5  Hip ext: 4+/5 4+/5  Hip abd: 4-/5 4+/5  Hip add: 4/5 5/5  Hip IR 3+/5 4/5  Hip ER 4-/5 4/5    UE MMT  LE MMT Left Right   Arm  flex:  (L2) 5/5 5/5  Shoulder ext: 4+/5 4+/5  Shoulder abd: 5/5 4/5  Shoulder add: 5/5 5/5  Shoulder IR 5/5 5/5  Shoulder ER 4+/5 4+/5     Abdominal: Deferred to follow-up Palpation: Diastasis:  Pelvic Floor External Exam: (performed 02/14/20) Introitus Appears: WNL Skin integrity: age related atrophy Palpation: TTP to B STP and L IC Cough: parodoxical Prolapse visible?: none Scar mobility: decreased/tight bands near introitus.  Internal Vaginal Exam: Strength (PERF): 2+/5. 10 sec. 2 times. Symmetry: assymetrical Palpation: TTP to R OI>coccygeus>posterior PR/PC. TO L Coccygeus>OI>anterior PR/PC and at posterior fourchette. Prolapse: none   Internal Rectal Exam: Strength (PERF): Symmetry: Palpation: Prolapse:  Gait Analysis: Pt. Not extending R hip due to pain in R achilles, taking shorter, slower strides.   Pelvic Floor Outcome Measures: FOTO, to be completed at second visit.  INTERVENTIONS THIS SESSION: Manual: Performet TP release to R sub-scapularis, supraspinatus, and infraspinatus to decrease spasm and  pain and allow for improved balance of musculature for improved function and decreased symptoms. Performed static cupping to B calves to improve fascial mobility and decrease tension acting on B plantar fascia and achilles tendons to allow for decreased pain and inflammation.  Self-care: Discussed bracing/inserts/heat/night splints with pt. To determine best short and long term solutions to allow for decreased pain and inflammation of the R achilles and to aid in reducing shoulder ROM and decreasing pain/spasm.  Total time: 70 min.                    Objective measurements completed on examination: See above findings.                 PT Short Term Goals - 05/29/20 0917      PT SHORT TERM GOAL #1   Title Patient will report a reduction in pain to no greater than 4/10 over the prior week to demonstrate symptom improvement.     Baseline LB, R shoulder, and L achilles with high pain of 9/10    Time 10    Period Weeks    Status New    Target Date 08/07/20      PT SHORT TERM GOAL #2   Title Pt. will describe 30% improvement in her confidence in balancing with directional changes such as bending/twisting to pick up her purse from the floor.    Baseline Pt. describes feeling off-balance every time she turns quickly    Time 10    Period Weeks    Status New    Target Date 08/07/20      PT SHORT TERM GOAL #3   Title Patient will demonstrate HEP x1 in the clinic to demonstrate understanding and proper form to allow for further improvement.    Baseline Pt. has not been performing HEP regularly due to extenuating circumstances and is not familiar with which therapeutic exercises will help improve her ankle and shoulder pain.    Time 10    Period Weeks    Status New    Target Date 08/07/20      PT SHORT TERM GOAL #4   Title Pt. will demonstrate appropriate coordination and performance of Kegel exercises and resolution of spasms to allow for continued decreases in UUI.    Baseline Pt. reports her UUI is still better than originally but has increased some following her break in PT    Time 5    Period Weeks    Status New    Target Date 07/03/20             PT Long Term Goals - 05/29/20 1207      PT LONG TERM GOAL #1   Title Patient will report no episodes of UUI over the course of the prior two weeks to demonstrate improved functional ability.    Baseline Having "some" UUI still though better than it was before.    Time 20    Period Weeks    Status New    Target Date 10/16/20      PT LONG TERM GOAL #2   Title Patient will be independent with HEP and stress-management routines in order to return to maintain long-term reduction of Sx.    Baseline Pt. has high stress following passing of her husband, RLS, and was not maintaining HEP following previous D/C from PT.    Time 20    Period Weeks    Status New     Target Date 08/07/20  PT LONG TERM GOAL #3   Title Pt. will improve in FOTO score by 10 points to demonstrate improved function.    Baseline FOTO not appropriately set up for new Sx. to be performed at second visit.    Time 20    Period Weeks    Status New    Target Date 10/16/20      PT LONG TERM GOAL #4   Title Patient will describe pain no greater than 2/10 during walking/exercising for > 45 min. sleeping, and ADL's.    Baseline Pain in R shoulder, LB, and R ankle with high of 9/10    Time 20    Period Weeks    Status New    Target Date 10/16/20      PT LONG TERM GOAL #5   Title Patient will have improved balance as evidenced by a score of 20/24 or better on the DGI.    Baseline scored 15/24 on 01/24/2020    Time 20    Period Weeks    Status New    Target Date 10/16/20      PT LONG TERM GOAL #6   Title Patient will reduce falls risk as indicated by Activities Specific Balance Confidence Scale (ABC) >67%.    Baseline scored 31% on 01/24/2020    Time 20    Period Weeks    Status New    Target Date 10/16/20                  Plan - 05/29/20 0845    Clinical Impression Statement Pt. is a 79 y/o female who presents today with multiple areas of pain and dysfunction including decreased balance, adhesive capsulitis of the R shoulder, achilles tendonitis of the R ankle, UUI, RLS, and LBP. She has completed 10 visits of PT earlier this year for UUI and has maintained some improvement but following the passing of her husband had taken a break from PT and is now returning to build on prior progress and address her other pain. Her PMH is significant for restless leg syndrome, scoliosis, anxiety, osteopenia, insomnia and a fall in August 2020 resulting in a L wrist fracture and L foot in a boot for months, recent achilles tendonintis of RLE. Her clinincal assessment revealed decreased R shoulder ROM, decreased R ankle DF ROM, spasms surrounding R scapula and through B calves as  well as significantly decreased fasical mobility through B calves and high sensitivity. Information was brought forward from recent pelvic exam due to time constraint and incomplete symptom resolution from prior episode. Pt. has a complex set of inter-regional dependent dysfunctions and injuries that will take a holistic approach to address and will likely be most limited by her RLS but she will benefit from skilled pelvic health PT to address the noted deficits and to continue to assess for nd address other potential causes of Sx.    Personal Factors and Comorbidities Comorbidity 3+    Comorbidities restless leg syndrome, sciliosis, anxiety, osteopenia, insomnia and a fall in August 2020 resulting in a L wrist fracture and L foot in a boot for months    Examination-Activity Limitations Continence;Sleep;Locomotion Level;Bend;Transfers;Stairs    Examination-Participation Restrictions Community Activity;Cleaning;Laundry    Stability/Clinical Decision Making Unstable/Unpredictable    Clinical Decision Making High    Rehab Potential Good    PT Frequency 1x / week    PT Duration Other (comment)   20 weeks   PT Treatment/Interventions ADLs/Self Care Home Management;Biofeedback;Moist Heat;Electrical Stimulation;Traction;Ultrasound;Therapeutic activities;Functional mobility  training;DME Instruction;Stair training;Gait training;Therapeutic exercise;Balance training;Neuromuscular re-education;Patient/family education;Orthotic Fit/Training;Manual techniques;Taping;Dry needling;Passive range of motion;Vestibular;Spinal Manipulations;Joint Manipulations    PT Next Visit Plan fascial work and spasm reduction surrounding R shoulder, B calves and LB, Review HEP and add scapular strengthening    PT Home Exercise Plan thomas-test hip-flexor stretch with hold-relax lengthening, side-stretch, VORx1, and Diaphragmatic breathing, child's pose stretch and pre-squeeze and sneeze", posterior pelvic tilts, standing balance and  heel-toe walking, bow-and-arrow, lumbar rotations, calf stretch, sleeper stretch.    Consulted and Agree with Plan of Care Patient           Patient will benefit from skilled therapeutic intervention in order to improve the following deficits and impairments:  Abnormal gait, Decreased balance, Decreased endurance, Difficulty walking, Increased muscle spasms, Decreased range of motion, Dizziness, Improper body mechanics, Impaired tone, Decreased coordination, Decreased strength, Increased fascial restricitons, Impaired flexibility, Postural dysfunction, Pain, Decreased mobility  Visit Diagnosis: Other idiopathic scoliosis, thoracolumbar region  Other muscle spasm  Difficulty in walking, not elsewhere classified  Dizziness and giddiness     Problem List Patient Active Problem List   Diagnosis Date Noted   Sprain of shoulder, right 11/21/2019   Urge incontinence 11/21/2019   Interstitial pulmonary disease ([place]) 09/13/2019   PAC (premature atrial contraction) 07/04/2019   Closed Colles' fracture 03/14/2019   Closed fracture of fifth metatarsal bone 03/14/2019   Osteopenia of multiple sites 09/24/2018   Hypoxemia 06/21/2018   Venous reflux 08/05/2017   Obstructive sleep apnea 07/17/2017   B12 deficiency 07/16/2017   Polypharmacy 07/16/2017   Depression 07/15/2017   Allergic rhinitis 06/24/2017   Anxiety 06/24/2017   Osteoarthritis 06/23/2017   Atypical chest pain 06/23/2017   Chronic back pain 06/23/2017   Female stress incontinence 06/23/2017   Hypercholesterolemia 06/23/2017   Osteoporosis 06/23/2017   Situational stress 06/23/2017   Vitamin D deficiency 06/23/2017   Gait instability 06/23/2017   RLS (restless legs syndrome) 12/31/2016   Insomnia 12/31/2016   Urinary incontinence 12/31/2016   Arthralgia 12/31/2016   DNAR (do not attempt resuscitation) 05/30/2014   Cataract 06/18/2012   Dizziness 03/02/2012   Referred otalgia  03/02/2012   Allergic rhinitis due to pollen 10/01/2007   Carpal tunnel syndrome 10/01/2007   Disorder of bone and cartilage 10/01/2007   Willa Rough DPT, ATC Willa Rough 05/29/2020, 3:31 PM  [facility] MAIN [facility] SERVICES [address] [place], [place], [address] Phone: [phone]   Fax:  [phone]  Name: Brittany Browning MRN: [id number] Date of Birth: [birth year]

## 2020-06-08 ENCOUNTER — Ambulatory Visit (INDEPENDENT_AMBULATORY_CARE_PROVIDER_SITE_OTHER): Payer: Medicare Other | Admitting: Podiatry

## 2020-06-08 ENCOUNTER — Ambulatory Visit: Payer: Medicare Other | Attending: Family Medicine

## 2020-06-08 ENCOUNTER — Other Ambulatory Visit: Payer: Self-pay

## 2020-06-08 ENCOUNTER — Encounter: Payer: Self-pay | Admitting: Podiatry

## 2020-06-08 DIAGNOSIS — M4125 Other idiopathic scoliosis, thoracolumbar region: Secondary | ICD-10-CM | POA: Diagnosis present

## 2020-06-08 DIAGNOSIS — M7661 Achilles tendinitis, right leg: Secondary | ICD-10-CM | POA: Diagnosis not present

## 2020-06-08 DIAGNOSIS — R42 Dizziness and giddiness: Secondary | ICD-10-CM | POA: Diagnosis present

## 2020-06-08 IMAGING — DX LEFT WRIST - COMPLETE 3+ VIEW
4 series · 4 of 4 positions shown · non-contrast
Comparison: None.

CLINICAL DATA: 78-year-old female with a fall and wrist pain

EXAM:
LEFT WRIST - COMPLETE 3+ VIEW

[wrist ap (1 of 2)]
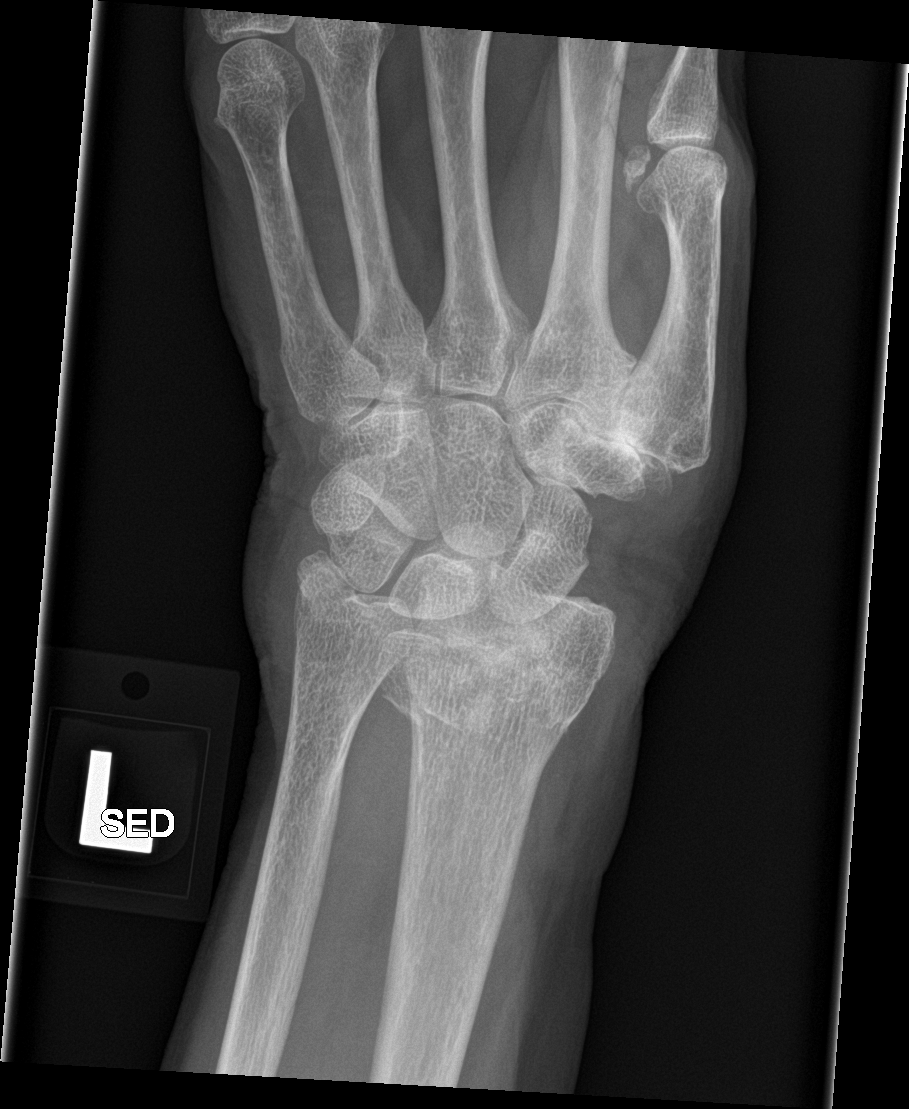

[wrist obl]
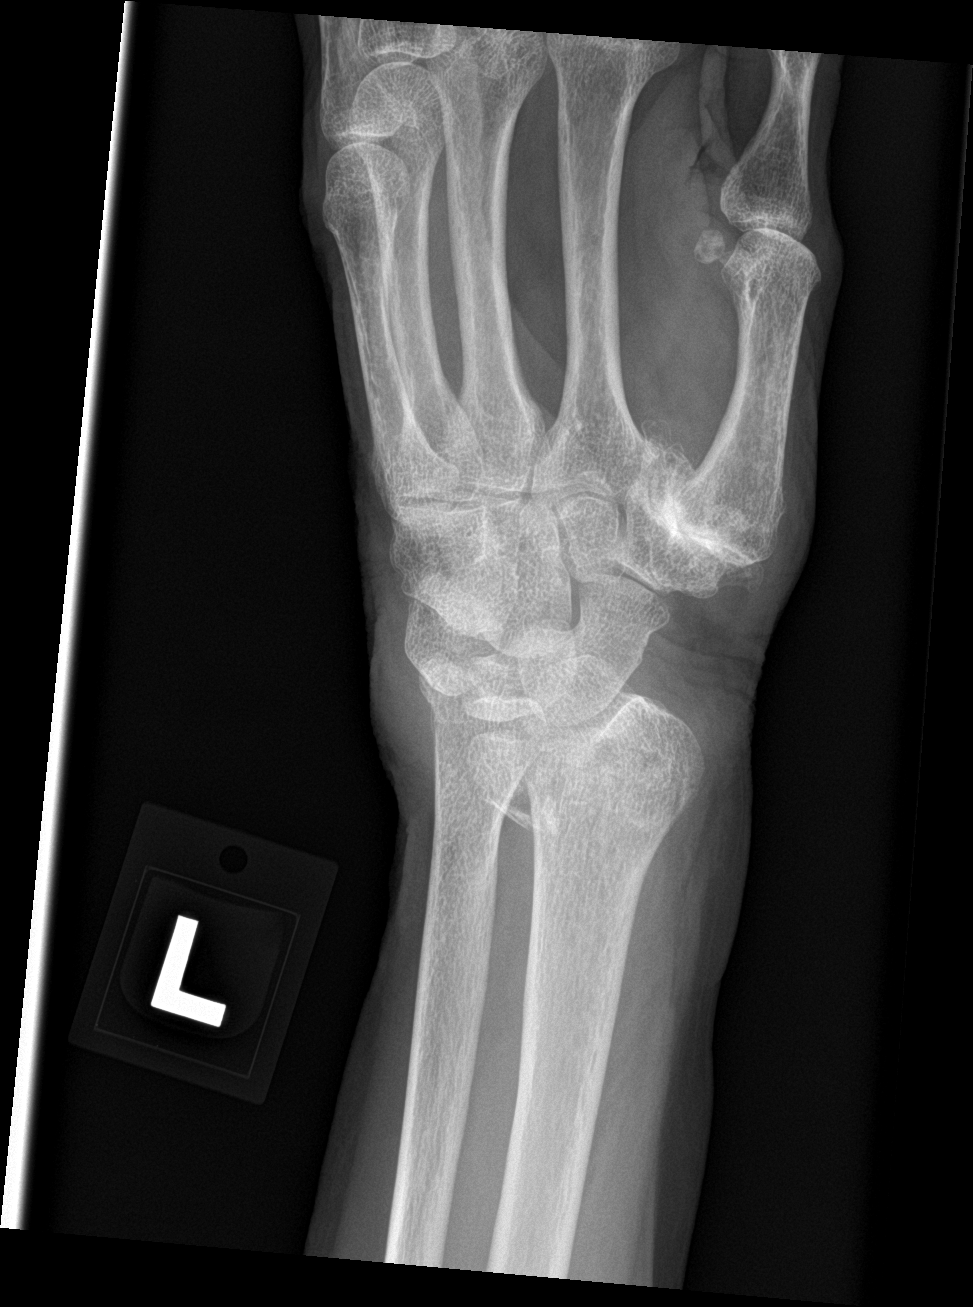

[wrist lat]
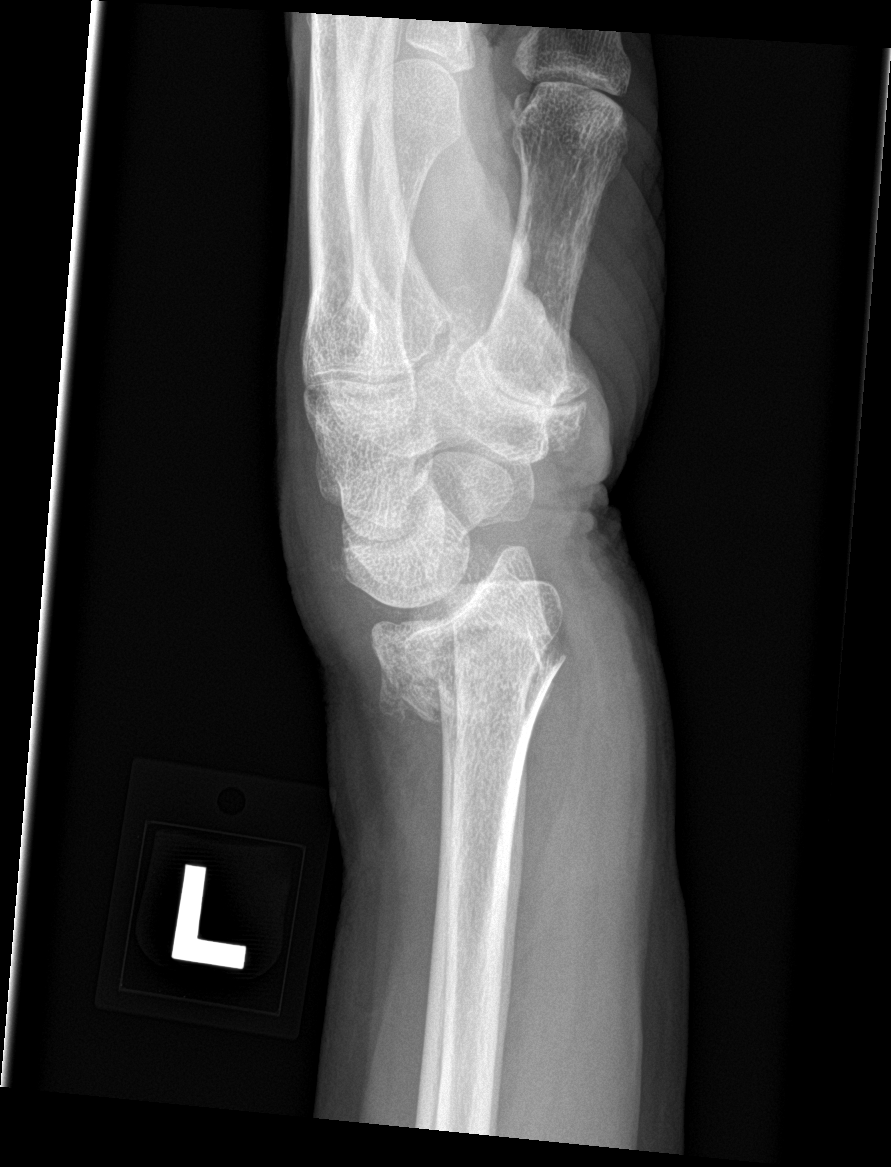

[wrist ap (2 of 2)]
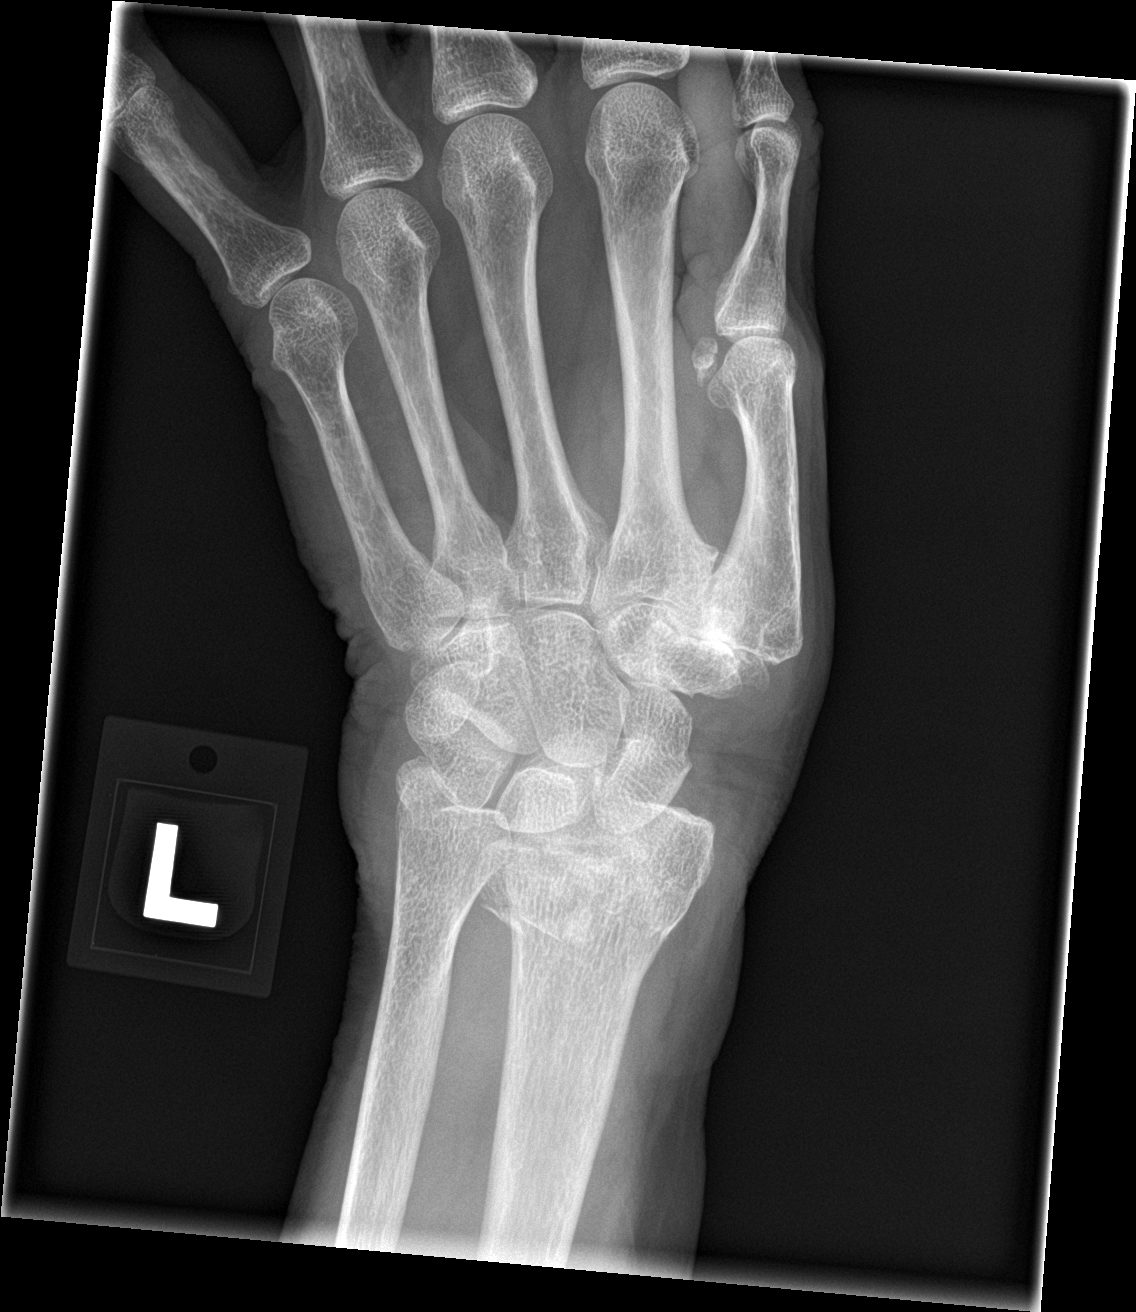

[4 of 4 positions shown; findings below may reference images not displayed]

FINDINGS: Osteopenia.

Comminuted fracture of the distal left radius. There is
approximately 40 degrees of volar angulation. Soft tissue swelling.
Ulnar styloid fracture.
IMPRESSION: Comminuted distal radius fracture with 40 degrees of volar
angulation.

Ulnar styloid fracture.

Soft tissue swelling.

## 2020-06-08 NOTE — Therapy (Addendum)
[place] MAIN [facility] SERVICES [address] [place], [place], [address] Phone: [phone]   Fax:  [phone]  Physical Therapy Treatment  The patient has been informed of current processes in place at Outpatient Rehab to protect patients from Covid-19 exposure including social distancing, schedule modifications, and new cleaning procedures. After discussing their particular risk with a therapist based on the patient's personal risk factors, the patient has decided to proceed with in-person therapy.  Patient Details  Name: Brittany Browning MRN: [id number] Date of Birth: 09-13-1940 No data recorded  Encounter Date: 06/08/2020   PT End of Session - 06/11/20 1001    Visit Number 2    Number of Visits 20    Date for PT Re-Evaluation 08/07/20    Authorization Type medicare/BCBS    Authorization Time Period 05/28/20 through 08/08/19    Authorization - Visit Number 2    Authorization - Number of Visits 10    Progress Note Due on Visit 10    PT Start Time 1130    PT Stop Time 1230    PT Time Calculation (min) 60 min    Activity Tolerance Patient tolerated treatment well;No increased pain    Behavior During Therapy WFL for tasks assessed/performed           Past Medical History:  Diagnosis Date  . Anemia   . Anxiety   . Arthritis    osteoarthritis  . Basal cell carcinoma (BCC)   . Cataract   . Complication of anesthesia    has had problems in the past. can tolerate propofol  . Environmental allergies   . Insomnia   . Osteopenia   . Restless leg syndrome   . Stress incontinence   . Willis-Ekbom syndrome     Past Surgical History:  Procedure Laterality Date  . CATARACT EXTRACTION W/PHACO Left 10/20/2017   Procedure: CATARACT EXTRACTION PHACO AND INTRAOCULAR LENS PLACEMENT (IOC);  Surgeon: Birder Robson, MD;  Location: [facility] ORS;  Service: Ophthalmology;  Laterality: Left;  [name] 00:39.7 AP% 12.9 CDE 5.09 Fluid Pack Lot # [id number]  .  COLONOSCOPY    . SKIN CANCER EXCISION    . TONSILLECTOMY AND ADENOIDECTOMY  1944    There were no vitals filed for this visit.     Pelvic Floor Physical Therapy Treatment Note  SCREENING  Changes in medications, allergies, or medical history?: Starting Suboxone in January    SUBJECTIVE  Patient reports: She has now found that she has a bone spur as of this morning. 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 She is nervous that it may make things worse because she has a friend that had a bad experience with it and last time she tried it didn't work but they are going to try a much smaller dose. She is still very stressed with everything for the [facility].  Precautions:  restless leg syndrome, scoliosis, anxiety, osteopenia, insomnia and a fall in August 2020 resulting in a L wrist fracture and L foot in a boot for months   Pain update:  Location of pain:R achilles tendon Current pain:7/10  Max pain:8/10 Least pain:3/10 Nature of pain:  **"feels a lot better" following treatment   Location of pain:R shoulder Current pain:5/10  Max pain:5/10 Least pain:5/10 Nature of pain:  Patient Goals: Not have pain in her shoulder or ankle, Decrease leakage, improve balance.   OBJECTIVE  Changes in: Posture/Observations:    Range  of Motion/Flexibilty:    Strength/MMT:  LE MMT:  Pelvic floor:  Abdominal:  Pt. Is over-engaging glutes/HS with posterior pelvic tilts leading to poor multifidus and TA recruitment with exercise.  **improved with cueing and Pt. Able to demonstrate 3 times correctily in a row with MIN cueing but review/follow up is necessary.   Palpation: TTP to L gastroc medially and laterally.  Gait Analysis:  INTERVENTIONS THIS SESSION: Therex: Reviewed and practiced posterior pelvic tilts in supine to improve deep-core coordination and recruitment and to improve efficacy of HEP to maintain  Sx. Improvement and continue to improve PFM Sx.   Self-care: Discussed how she likely has some component of both tendonitis and a bone-spur and how to use it without over-using it to prevent perpetuation of inflammation and pain.  Manual: Performed TP release and STM to L gastroc medially and laterally to decrease spasm and pain and allow for improved balance of musculature for improved function and decreased symptoms.  Dry-needle: Performed TPDN with a .30x71mm needle and standard approach as described below to decrease spasm and pain and allow for improved balance of musculature for improved function and decreased symptoms.  Ultrasound: Performed pulsed ultrasound with a 20% duty cycle and intensity of 2.0 to L achilles tendon to decrease inflammation and pain and help minimize bone-spur formation/ aid in any re-absorption that may occur.  Total time: 60 min.                         PT Short Term Goals - 05/29/20 0917      PT SHORT TERM GOAL #1   Title Patient will report a reduction in pain to no greater than 4/10 over the prior week to demonstrate symptom improvement.    Baseline LB, R shoulder, and L achilles with high pain of 9/10    Time 10    Period Weeks    Status New    Target Date 08/07/20      PT SHORT TERM GOAL #2   Title Pt. will describe 30% improvement in her confidence in balancing with directional changes such as bending/twisting to pick up her purse from the floor.    Baseline Pt. describes feeling off-balance every time she turns quickly    Time 10    Period Weeks    Status New    Target Date 08/07/20      PT SHORT TERM GOAL #3   Title Patient will demonstrate HEP x1 in the clinic to demonstrate understanding and proper form to allow for further improvement.    Baseline Pt. has not been performing HEP regularly due to extenuating circumstances and is not familiar with which therapeutic exercises will help improve her ankle and shoulder pain.     Time 10    Period Weeks    Status New    Target Date 08/07/20      PT SHORT TERM GOAL #4   Title Pt. will demonstrate appropriate coordination and performance of Kegel exercises and resolution of spasms to allow for continued decreases in UUI.    Baseline Pt. reports her UUI is still better than originally but has increased some following her break in PT    Time 5    Period Weeks    Status New    Target Date 07/03/20             PT Long Term Goals - 05/29/20 1207      PT LONG TERM GOAL #1  Title Patient will report no episodes of UUI over the course of the prior two weeks to demonstrate improved functional ability.    Baseline Having "some" UUI still though better than it was before.    Time 20    Period Weeks    Status New    Target Date 10/16/20      PT LONG TERM GOAL #2   Title Patient will be independent with HEP and stress-management routines in order to return to maintain long-term reduction of Sx.    Baseline Pt. has high stress following passing of her husband, RLS, and was not maintaining HEP following previous D/C from PT.    Time 20    Period Weeks    Status New    Target Date 08/07/20      PT LONG TERM GOAL #3   Title Pt. will improve in FOTO score by 10 points to demonstrate improved function.    Baseline FOTO not appropriately set up for new Sx. to be performed at second visit.    Time 20    Period Weeks    Status New    Target Date 10/16/20      PT LONG TERM GOAL #4   Title Patient will describe pain no greater than 2/10 during walking/exercising for > 45 min. sleeping, and ADL's.    Baseline Pain in R shoulder, LB, and R ankle with high of 9/10    Time 20    Period Weeks    Status New    Target Date 10/16/20      PT LONG TERM GOAL #5   Title Patient will have improved balance as evidenced by a score of 20/24 or better on the DGI.    Baseline scored 15/24 on 01/24/2020    Time 20    Period Weeks    Status New    Target Date 10/16/20       PT LONG TERM GOAL #6   Title Patient will reduce falls risk as indicated by Activities Specific Balance Confidence Scale (ABC) >67%.    Baseline scored 31% on 01/24/2020    Time 20    Period Weeks    Status New    Target Date 10/16/20                 Plan - 06/11/20 1002    Clinical Impression Statement Pt. responded well to all interventions today, demonstrating improved performance and understanding of her posterior pelvic tilts and decreased pain in her L ankle. She continues to demonstrate very high stress levels and is most effected by her RLS Sx. as of this date she has decided not to postpone treatment until after the new year due to continuing to deal with the loss of her husband and attempting to try a different medical option to decrease her RLS Sx. which will take time to see results. She will continue to benefit from skilled PT to address her remaining defecits at the time that she is able to return to PT and would benefit from counselling at this time.    Personal Factors and Comorbidities Comorbidity 3+    Comorbidities restless leg syndrome, sciliosis, anxiety, osteopenia, insomnia and a fall in August 2020 resulting in a L wrist fracture and L foot in a boot for months    Examination-Activity Limitations Continence;Sleep;Locomotion Level;Bend;Transfers;Stairs    Examination-Participation Restrictions Community Activity;Cleaning;Laundry    Stability/Clinical Decision Making Unstable/Unpredictable    Rehab Potential Good    PT Frequency 1x /  week    PT Duration Other (comment)   20 weeks   PT Treatment/Interventions ADLs/Self Care Home Management;Biofeedback;Moist Heat;Electrical Stimulation;Traction;Ultrasound;Therapeutic activities;Functional mobility training;DME Instruction;Stair training;Gait training;Therapeutic exercise;Balance training;Neuromuscular re-education;Patient/family education;Orthotic Fit/Training;Manual techniques;Taping;Dry needling;Passive range of  motion;Vestibular;Spinal Manipulations;Joint Manipulations    PT Next Visit Plan fascial work and spasm reduction surrounding R shoulder, B calves and LB, Review HEP and add scapular strengthening    PT Home Exercise Plan thomas-test hip-flexor stretch with hold-relax lengthening, side-stretch, VORx1, and Diaphragmatic breathing, child's pose stretch and pre-squeeze and sneeze", posterior pelvic tilts, standing balance and heel-toe walking, bow-and-arrow, lumbar rotations, calf stretch, sleeper stretch.    Consulted and Agree with Plan of Care Patient           Patient will benefit from skilled therapeutic intervention in order to improve the following deficits and impairments:  Abnormal gait, Decreased balance, Decreased endurance, Difficulty walking, Increased muscle spasms, Decreased range of motion, Dizziness, Improper body mechanics, Impaired tone, Decreased coordination, Decreased strength, Increased fascial restricitons, Impaired flexibility, Postural dysfunction, Pain, Decreased mobility  Visit Diagnosis: Other idiopathic scoliosis, thoracolumbar region  Dizziness and giddiness     Problem List Patient Active Problem List   Diagnosis Date Noted  . Sprain of shoulder, right 11/21/2019  . Urge incontinence 11/21/2019  . Interstitial pulmonary disease ([place]) 09/13/2019  . PAC (premature atrial contraction) 07/04/2019  . Closed Colles' fracture 03/14/2019  . Closed fracture of fifth metatarsal bone 03/14/2019  . Osteopenia of multiple sites 09/24/2018  . Hypoxemia 06/21/2018  . Venous reflux 08/05/2017  . Obstructive sleep apnea 07/17/2017  . B12 deficiency 07/16/2017  . Polypharmacy 07/16/2017  . Depression 07/15/2017  . Allergic rhinitis 06/24/2017  . Anxiety 06/24/2017  . Osteoarthritis 06/23/2017  . Atypical chest pain 06/23/2017  . Chronic back pain 06/23/2017  . Female stress incontinence 06/23/2017  . Hypercholesterolemia 06/23/2017  . Osteoporosis 06/23/2017  .  Situational stress 06/23/2017  . Vitamin D deficiency 06/23/2017  . Gait instability 06/23/2017  . RLS (restless legs syndrome) 12/31/2016  . Insomnia 12/31/2016  . Urinary incontinence 12/31/2016  . Arthralgia 12/31/2016  . DNAR (do not attempt resuscitation) 05/30/2014  . Cataract 06/18/2012  . Dizziness 03/02/2012  . Referred otalgia 03/02/2012  . Allergic rhinitis due to pollen 10/01/2007  . Carpal tunnel syndrome 10/01/2007  . Disorder of bone and cartilage 10/01/2007   Brittany Browning DPT, ATC Brittany Browning 06/11/2020, 10:08 AM  [place] MAIN [facility] SERVICES [address] [place], [place], [address] Phone: [phone]   Fax:  [phone]  Name: Brittany Browning MRN: [id number] Date of Birth: [birth year]

## 2020-06-12 ENCOUNTER — Ambulatory Visit: Payer: Self-pay | Admitting: Nurse Practitioner

## 2020-06-13 ENCOUNTER — Ambulatory Visit (INDEPENDENT_AMBULATORY_CARE_PROVIDER_SITE_OTHER): Payer: Medicare Other | Admitting: Orthotics

## 2020-06-13 ENCOUNTER — Other Ambulatory Visit: Payer: Self-pay

## 2020-06-13 DIAGNOSIS — M7661 Achilles tendinitis, right leg: Secondary | ICD-10-CM

## 2020-06-13 DIAGNOSIS — M7662 Achilles tendinitis, left leg: Secondary | ICD-10-CM

## 2020-06-13 NOTE — Progress Notes (Signed)
   HPI: 79 y.o. female presenting today for follow-up evaluation of Achilles tendinitis to the right lower extremity.  Patient states she is not improved.  She now has a bump on the Achilles tendon that hurts when her foot is in a certain position.  She presents today for further treatment and evaluation  Past Medical History:  Diagnosis Date  . Anemia   . Anxiety   . Arthritis    osteoarthritis  . Basal cell carcinoma (BCC)   . Cataract   . Complication of anesthesia    has had problems in the past. can tolerate propofol  . Environmental allergies   . Insomnia   . Osteopenia   . Restless leg syndrome   . Stress incontinence   . Willis-Ekbom syndrome      Physical Exam: General: The patient is alert and oriented x3 in no acute distress.  Dermatology: Skin is warm, dry and supple bilateral lower extremities. Negative for open lesions or macerations.  Vascular: Palpable pedal pulses bilaterally. No edema or erythema noted. Capillary refill within normal limits.  Neurological: Epicritic and protective threshold grossly intact bilaterally.   Musculoskeletal Exam: Range of motion within normal limits to all pedal and ankle joints bilateral. Muscle strength 5/5 in all groups bilateral.  Pain on palpation along the mid substance of the Achilles tendon right lower extremity.  There is also a palpable nodule at the distal end of the Achilles tendon just as it inserts onto the posterior body of the calcaneus  Assessment: 1.  Achilles tendinitis right 2.  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 Patient evaluated. 2.  Discontinue meloxicam.  Patient states that the meloxicam does not help alleviate symptoms 3.  Patient also states that the ankle brace aggravated her Achilles tendon area more so than before she was wearing it For.  Silicone heel sleeve was dispensed today 5.  Appointment with Pedorthist for custom  molded orthotics 6.  Return to clinic as needed     Edrick Kins, DPM [facility]  Dr. Edrick Kins, DPM    [address], [place] [address]                Office [phone]  Fax [phone]

## 2020-06-15 ENCOUNTER — Ambulatory Visit: Payer: Medicare Other | Admitting: Podiatry

## 2020-06-19 ENCOUNTER — Ambulatory Visit: Payer: Medicare Other | Admitting: Podiatry

## 2020-07-04 ENCOUNTER — Other Ambulatory Visit: Payer: Medicare Other | Admitting: Orthotics

## 2020-07-13 ENCOUNTER — Other Ambulatory Visit: Payer: Self-pay

## 2020-07-13 ENCOUNTER — Encounter: Payer: Self-pay | Admitting: Podiatry

## 2020-07-13 ENCOUNTER — Ambulatory Visit (INDEPENDENT_AMBULATORY_CARE_PROVIDER_SITE_OTHER): Payer: Medicare Other | Admitting: Podiatry

## 2020-07-13 DIAGNOSIS — M7661 Achilles tendinitis, right leg: Secondary | ICD-10-CM

## 2020-07-17 NOTE — Progress Notes (Signed)
   HPI: 79 y.o. female presenting today for follow-up evaluation of Achilles tendinitis to the right lower extremity.  Patient states she is not improved.  She continues to have pain and sensitivity to the Achilles tendon of the extremity.  No new complaints at this time Past Medical History:  Diagnosis Date  . Anemia   . Anxiety   . Arthritis    osteoarthritis  . Basal cell carcinoma (BCC)   . Cataract   . Complication of anesthesia    has had problems in the past. can tolerate propofol  . Environmental allergies   . Insomnia   . Osteopenia   . Restless leg syndrome   . Stress incontinence   . Willis-Ekbom syndrome      Physical Exam: General: The patient is alert and oriented x3 in no acute distress.  Dermatology: Skin is warm, dry and supple bilateral lower extremities. Negative for open lesions or macerations.  Vascular: Palpable pedal pulses bilaterally. No edema or erythema noted. Capillary refill within normal limits.  Neurological: Epicritic and protective threshold grossly intact bilaterally.   Musculoskeletal Exam: Range of motion within normal limits to all pedal and ankle joints bilateral. Muscle strength 5/5 in all groups bilateral.  Pain on palpation along the mid substance of the Achilles tendon right lower extremity.  There is also a palpable nodule at the distal end of the Achilles tendon just as it inserts onto the posterior body of the calcaneus  Assessment: 1.  Achilles tendinitis right 2.  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 Patient evaluated. 2.  Today we are going to order physical therapy at [facility] PT for Achilles tendinitis right 3.  Recommend a night splint to wear while sleeping 4.  Continue wearing ankle brace right 5.  Appointment with Pedorthist set up for 07/18/2020 for custom orthotics 6.  Resume meloxicam 15 mg daily 7.  Return to clinic in 6 weeks   Edrick Kins, DPM [facility]  Dr. Edrick Kins, DPM    [address] [place], [place] [address]                Office [phone]  Fax [phone]

## 2020-07-18 ENCOUNTER — Encounter: Payer: Medicare Other | Admitting: Orthotics

## 2020-07-18 ENCOUNTER — Telehealth: Payer: Self-pay | Admitting: *Deleted

## 2020-07-18 NOTE — Telephone Encounter (Addendum)
I'm calling to let you know that you left your prescription for Physical Therapy.  "I'll stop by there today to pick it up.  Thanks for letting me know."  I will leave it at the front desk.

## 2020-07-24 ENCOUNTER — Ambulatory Visit: Payer: Medicare Other | Admitting: Podiatry

## 2020-07-25 ENCOUNTER — Ambulatory Visit: Payer: Medicare Other | Admitting: Orthotics

## 2020-07-25 ENCOUNTER — Other Ambulatory Visit: Payer: Self-pay

## 2020-07-25 ENCOUNTER — Encounter: Payer: Medicare Other | Admitting: Orthotics

## 2020-07-25 DIAGNOSIS — R2681 Unsteadiness on feet: Secondary | ICD-10-CM

## 2020-07-25 NOTE — Progress Notes (Signed)
Patient picked up f/o and was pleased with fit, comfort, and function.  Worked well with footwear.  Told of rbeak in period and how to report any issues.  

## 2020-07-25 NOTE — Patient Instructions (Signed)
Rosen's Emergency Medicine: Concepts and Clinical Practice (9th ed., pp. 1392-1401). [place], [place]: Elsevier, Inc. Retrieved from https://www.clinicalkey.com/#!/content/book/3-[email or web address]-[email or web address]?scrollTo=%23hl0000251">  Achilles Tendinitis  Achilles tendinitis is inflammation of the tough, cord-like band that attaches the lower leg muscles to the heel bone (Achilles tendon). This is usually caused by overusing the tendon and the ankle joint. Achilles tendinitis usually gets better over time with treatment and caring for yourself at home. It can take weeks or months to heal completely. What are the causes? This condition may be caused by:  A sudden increase in exercise or activity, such as running.  Doing the same exercises or activities, such as jumping, over and over.  Not warming up calf muscles before exercising.  Exercising in shoes that are worn out or not made for exercise.  Having arthritis or a bone growth (spur) on the back of the heel bone. This can rub against the tendon and hurt it.  Age-related wear and tear. Tendons become less flexible with age and are more likely to be injured. What are the signs or symptoms? Common symptoms of this condition include:  Pain in the Achilles tendon or in the back of the leg, just above the heel. The pain usually gets worse with exercise.  Stiffness or soreness in the back of the leg, especially in the morning.  Swelling of the skin over the Achilles tendon.  Thickening of the tendon.  Trouble standing on tiptoe. How is this diagnosed? This condition is diagnosed based on your symptoms and a physical exam. You may have tests, including:  X-rays.  MRI. How is this treated? The goal of treatment is to relieve symptoms and help your injury heal. Treatment may include:  Decreasing or stopping activities that caused the tendinitis. This may mean switching to low-impact exercises like biking or swimming.  Icing the injured  area.  Doing physical therapy, including strengthening and stretching exercises.  Taking NSAIDs, such as ibuprofen, to help relieve pain and swelling.  Using supportive shoes, wraps, heel lifts, or a walking boot (air cast).  Having surgery. This may be done if your symptoms do not improve after other treatments.  Using high-energy shock wave impulses to stimulate the healing process (extracorporeal shock wave therapy). This is rare.  Having an injection of medicines that help relieve inflammation (corticosteroids). This is rare. Follow these instructions at home: If you have an air cast:  Wear the air cast as told by your health care provider. Remove it only as told by your health care provider.  Loosen it if your toes tingle, become numb, or turn cold and blue.  Keep it clean.  If the air cast is not waterproof: ? Do not let it get wet. ? Cover it with a watertight covering when you take a bath or shower. Managing pain, stiffness, and swelling   If directed, put ice on the injured area. To do this: ? If you have a removable air cast, remove it as told by your health care provider. ? Put ice in a plastic bag. ? Place a towel between your skin and the bag. ? Leave the ice on for 20 minutes, 2-3 times a day.  Move your toes often to reduce stiffness and swelling.  Raise (elevate) your foot above the level of your heart while you are sitting or lying down. Activity  Gradually return to your normal activities as told by your health care provider. Ask your health care provider what activities are safe for you.  Do not do   activities that cause pain.  Consider doing low-impact exercises, like cycling or swimming.  Ask your health care provider when it is safe to drive if you have an air cast on your foot.  If physical therapy was prescribed, do exercises as told by your health care provider or physical therapist. General instructions  If directed, wrap your foot with an  elastic bandage or other wrap. This can help to keep your tendon from moving too much while it heals. Your health care provider will show you how to wrap your foot correctly.  Wear supportive shoes or heel lifts only as told by your health care provider.  Take over-the-counter and prescription medicines only as told by your health care provider.  Keep all follow-up visits as told by your health care provider. This is important. Contact a health care provider if you:  Have symptoms that get worse.  Have pain that does not get better with medicine.  Develop new, unexplained symptoms.  Develop warmth and swelling in your foot.  Have a fever. Get help right away if you:  Have a sudden popping sound or sensation in your Achilles tendon followed by severe pain.  Cannot move your toes or foot.  Cannot put any weight on your foot.  Your foot or toes become numb and look white or blue even after loosening your bandage or air cast. Summary  Achilles tendinitis is inflammation of the tough, cord-like band that attaches the lower leg muscles to the heel bone (Achilles tendon).  This condition is usually caused by overusing the tendon and the ankle joint. It can also be caused by arthritis or normal aging.  The most common symptoms of this condition include pain, swelling, or stiffness in the Achilles tendon or in the back of the leg.  This condition is usually treated by decreasing or stopping activities that caused the tendinitis, icing the injured area, taking NSAIDs, and doing physical therapy. This information is not intended to replace advice given to you by your health care provider. Make sure you discuss any questions you have with your health care provider. Document Revised: 12/06/2018 Document Reviewed: 12/06/2018 Elsevier Patient Education  2020 Elsevier Inc.  

## 2020-08-10 ENCOUNTER — Ambulatory Visit: Payer: Medicare Other | Admitting: Podiatry

## 2020-08-14 ENCOUNTER — Ambulatory Visit: Admission: RE | Admit: 2020-08-14 | Payer: Medicare Other | Source: Home / Self Care | Admitting: Ophthalmology

## 2020-08-14 ENCOUNTER — Encounter: Admission: RE | Payer: Self-pay | Source: Home / Self Care

## 2020-08-14 SURGERY — PHACOEMULSIFICATION, CATARACT, WITH IOL INSERTION
Anesthesia: Topical | Laterality: Right

## 2020-11-24 ENCOUNTER — Other Ambulatory Visit: Payer: Self-pay

## 2020-11-24 ENCOUNTER — Ambulatory Visit
Admission: RE | Admit: 2020-11-24 | Discharge: 2020-11-24 | Disposition: A | Discharge status: Home / Self Care | Payer: Medicare Other | Source: Ambulatory Visit | Attending: Student | Admitting: Student

## 2020-11-24 ENCOUNTER — Other Ambulatory Visit: Payer: Self-pay | Admitting: Student

## 2020-11-24 DIAGNOSIS — M79604 Pain in right leg: Secondary | ICD-10-CM

## 2020-11-24 DIAGNOSIS — M7989 Other specified soft tissue disorders: Secondary | ICD-10-CM | POA: Insufficient documentation

## 2020-12-24 ENCOUNTER — Encounter: Payer: Self-pay | Admitting: Cardiology

## 2020-12-24 ENCOUNTER — Other Ambulatory Visit: Payer: Self-pay

## 2020-12-24 ENCOUNTER — Ambulatory Visit (INDEPENDENT_AMBULATORY_CARE_PROVIDER_SITE_OTHER): Payer: Medicare Other | Admitting: Cardiology

## 2020-12-24 VITALS — BP 106/60 | HR 68 | Ht 66.0 in | Wt 151.0 lb

## 2020-12-24 DIAGNOSIS — I491 Atrial premature depolarization: Secondary | ICD-10-CM | POA: Diagnosis not present

## 2020-12-24 DIAGNOSIS — R0609 Other forms of dyspnea: Secondary | ICD-10-CM

## 2020-12-24 DIAGNOSIS — R0600 Dyspnea, unspecified: Secondary | ICD-10-CM

## 2020-12-24 NOTE — Patient Instructions (Signed)
Medication Instructions:   Your physician recommends that you continue on your current medications as directed. Please refer to the Current Medication list given to you today.   *If you need a refill on your cardiac medications before your next appointment, please call your pharmacy*   Lab Work: None ordered  If you have labs (blood work) drawn today and your tests are completely normal, you will receive your results only by: Marland Kitchen MyChart Message (if you have MyChart) OR . A paper copy in the mail If you have any lab test that is abnormal or we need to change your treatment, we will call you to review the results.   Testing/Procedures:   [place]    Your caregiver has ordered a Stress Test with nuclear imaging. The purpose of this test is to evaluate the blood supply to your heart muscle. This procedure is referred to as a "Non-Invasive Stress Test." This is because other than having an IV started in your vein, nothing is inserted or "invades" your body. Cardiac stress tests are done to find areas of poor blood flow to the heart by determining the extent of coronary artery disease (CAD). Some patients exercise on a treadmill, which naturally increases the blood flow to your heart, while others who are  unable to walk on a treadmill due to physical limitations have a pharmacologic/chemical stress agent called Lexiscan . This medicine will mimic walking on a treadmill by temporarily increasing your coronary blood flow.      PLEASE REPORT TO [facility] MEDICAL MALL ENTRANCE   THE VOLUNTEERS AT THE FIRST DESK WILL DIRECT YOU WHERE TO GO     *Please note: these test may take anywhere between 2-4 hours to complete       Date of Procedure:_____________________________________   Arrival Time for Procedure:______________________________    PLEASE NOTIFY THE OFFICE AT LEAST 24 HOURS IN ADVANCE IF YOU ARE UNABLE TO KEEP YOUR APPOINTMENT.  [place] 24 HOURS IN ADVANCE IF YOU ARE UNABLE TO KEEP YOUR APPOINTMENT. [phone]       How to prepare for your Myoview test:    1. Do not eat or drink after midnight  2. No caffeine for 24 hours prior to test  3. No smoking 24 hours prior to test.  4. Unless instructed otherwise, Take your medication with a small sips of water.    5.         Ladies, please do not wear dresses. Skirts or pants are appropriate. Please wear a short sleeve shirt.  6. No perfume, cologne or lotion.  7. Wear comfortable walking shoes. No heels!     Follow-Up: At [facility], you and your health needs are our priority.  As part of our continuing mission to provide you with exceptional heart care, we have created designated Provider Care Teams.  These Care Teams include your primary Cardiologist (physician) and Advanced Practice Providers (APPs -  Physician Assistants and Nurse Practitioners) who all work together to provide you with the care you need, when you need it.  We recommend signing up for the patient portal called "MyChart".  Sign up information is provided on this After Visit Summary.  MyChart is used to connect with patients for Virtual Visits (Telemedicine).  Patients are able to view lab/test results, encounter notes, upcoming appointments, etc.  Non-urgent messages can be sent to your provider as well.   To learn more about what you can do  with MyChart, go to [email or web address].    Your next appointment:   6 week(s)  The format for your next appointment:   In Person  Provider:   Kate Sable, MD   Other Instructions

## 2020-12-24 NOTE — Progress Notes (Signed)
Cardiology Office Note:    Date:  12/24/2020   ID:  Brittany Browning, DOB [birth year], MRN [id number]  PCP:  Wardell Honour, MD   [facility] Providers Cardiologist:  None     Referring MD: Wardell Honour, MD   Chief Complaint  Patient presents with  . New Patient (Initial Visit)    Referred by PCP for  PAC, SVT, PVC, DOE. Meds reviewed verbally with patient.     History of Present Illness:    Brittany Browning is a 80 y.o. female with a hx of anxiety, restless leg syndrome, OSA, mild ILD, on oxygen nightly who presents due to shortness of breath and premature beats.  Patient states having worsening shortness of breath over the past several weeks.  Also has right knee pain, not sure if she twisted it.  Was placed on steroids with some improvement.  Planning on following up with PCP regarding orthopedist or rheumatology referral.  She uses oxygen at night, follows up with pulmonary medicine at [facility].  She denies chest pain with exertion.  Echocardiogram at [facility] [id number] normal systolic function, EF 50 to 55%, Cardiac monitor at [facility] on 11/15/2020 PACs, PVCs, occasional paroxysmal SVTs.  Patient triggered events coincided with ectopy and sinus tach.  PAC burden 5.78%, PVC burden 0.62%.  Past Medical History:  Diagnosis Date  . Anemia   . Anxiety   . Arthritis    osteoarthritis  . Basal cell carcinoma (BCC)   . Cataract   . Complication of anesthesia    has had problems in the past. can tolerate propofol  . Environmental allergies   . Insomnia   . Osteopenia   . Restless leg syndrome   . Stress incontinence   . Willis-Ekbom syndrome     Past Surgical History:  Procedure Laterality Date  . CATARACT EXTRACTION W/PHACO Left 10/20/2017   Procedure: CATARACT EXTRACTION PHACO AND INTRAOCULAR LENS PLACEMENT (IOC);  Surgeon: Birder Robson, MD;  Location: [facility] ORS;  Service: Ophthalmology;  Laterality: Left;  [name] 00:39.7 AP% 12.9 CDE 5.09 Fluid Pack Lot # [id number]  .  COLONOSCOPY    . SKIN CANCER EXCISION    . TONSILLECTOMY AND ADENOIDECTOMY  1944    Current Medications: Current Meds  Medication Sig  . aspirin 81 MG EC tablet Take 81 mg by mouth daily.  . Azelastine HCl 0.15 % SOLN Place [id number] application into the nose. Place 2 sprays into both nostrils nightly  . Bioflavonoid Products (ESTER-C) TABS Take 1 tablet by mouth daily.  . Buprenorphine HCl-Naloxone HCl 2-0.5 MG FILM buprenorphine 2 mg-naloxone 0.5 mg sublingual film  . cholecalciferol (VITAMIN D) 1000 units tablet Take 1,000 Units by mouth daily.  . Cinnamon 500 MG capsule Take 2 capsules by mouth daily.   Marland Kitchen docusate sodium (COLACE) 100 MG capsule   . Estradiol (VAGIFEM) 10 MCG TABS vaginal tablet Place 10 mcg vaginally.  . ferrous sulfate (SLOW RELEASE IRON) 160 (50 Fe) MG TBCR SR tablet   . fluocinonide ointment (LIDEX) 0.05 % fluocinonide 0.05 % topical ointment  . folic acid (FOLVITE) 1 MG tablet Take 1,200 mcg by mouth daily.   . Glucosamine-Chondroitin (COSAMIN DS PO) Take 1 tablet by mouth 2 (two) times daily.   . Hypertonic Nasal Wash (SINUS RINSE NA) Place 1 application into the nose daily.  Marland Kitchen ipratropium (ATROVENT HFA) 17 MCG/ACT inhaler Inhale 2 puffs into the lungs every 6 (six) hours as needed.  Marland Kitchen ketotifen (ZADITOR) 0.025 % ophthalmic solution Apply 1 drop to  eye 2 (two) times daily as needed.  Marland Kitchen levocetirizine (XYZAL) 5 MG tablet Take 1 tablet by mouth daily with breakfast.   . MAGNESIUM GLUCONATE PO Take 140 mg by mouth daily.  . montelukast (SINGULAIR) 10 MG tablet Take 1 tablet by mouth daily.  . Multiple Vitamin (MULTIVITAMIN) capsule Take 1 capsule by mouth daily.  . NONFORMULARY OR COMPOUNDED ITEM Cream with cannabis oil  . Omega-3 Fatty Acids (RA FISH OIL) 1000 MG CAPS   . ondansetron (ZOFRAN-ODT) 4 MG disintegrating tablet Take 4 mg by mouth as needed for nausea or vomiting.  . pramipexole (MIRAPEX) 0.125 MG tablet Take 0.125 mg by mouth daily.  . Rotigotine 1  MG/24HR PT24 Place onto the skin.  Marland Kitchen triamcinolone (NASACORT) 55 MCG/ACT AERO nasal inhaler Place into the nose.     Allergies:   Lactose, Bupropion, Lactose intolerance (gi), Sulfa antibiotics, and Zolpidem   Social History   Socioeconomic History  . Marital status: Widowed    Spouse name: Not on file  . Number of children: Not on file  . Years of education: Not on file  . Highest education level: Not on file  Occupational History  . Not on file  Tobacco Use  . Smoking status: Never Smoker  . Smokeless tobacco: Never Used  Vaping Use  . Vaping Use: Never used  Substance and Sexual Activity  . Alcohol use: No  . Drug use: No  . Sexual activity: Not on file  Other Topics Concern  . Not on file  Social History Narrative   Widowed in 2021.   Resides in [place].   Retired.   Social Determinants of Health   Financial Resource Strain: Not on file  Food Insecurity: Not on file  Transportation Needs: Not on file  Physical Activity: Not on file  Stress: Not on file  Social Connections: Not on file     Family History: The patient's family history is negative for Breast cancer.  ROS:   Please see the history of present illness.     All other systems reviewed and are negative.  EKGs/Labs/Other Studies Reviewed:    The following studies were reviewed today: Outside studies suggest 10/31/2019 CT chest without contrast: Mild basilar subpleural reticulation with thickened interlobular septa, subpleural traction bronchiectasis and scattered groundglass attenuation opacities. Predominantly in lung bases. Progressed slightly from prior. No honeycombing. Indeterminate by ATS criteria.  10/31/2018 CT Chest without contrast: Persistent findings of interstitial lung disease, which remain indeterminate by ATS criteria, but may show slight interval progression from prior.   EKG:  EKG is  ordered today.  The ekg ordered today demonstrates normal sinus rhythm  Recent Labs: No  results found for requested labs within last 8760 hours.  Recent Lipid Panel No results found for: CHOL, TRIG, HDL, CHOLHDL, VLDL, LDLCALC, LDLDIRECT   Risk Assessment/Calculations:      Physical Exam:    VS:  BP 106/60 (BP Location: Left Arm, Patient Position: Sitting, Cuff Size: Normal)   Pulse 68   Ht 5\' 6"  (1.676 m)   Wt 151 lb (68.5 kg)   SpO2 90%   BMI 24.37 kg/m     Wt Readings from Last 3 Encounters:  12/24/20 151 lb (68.5 kg)  03/12/19 145 lb (65.8 kg)  10/06/17 144 lb (65.3 kg)     GEN:  Well nourished, well developed in no acute distress HEENT: Normal NECK: No JVD; No carotid bruits LYMPHATICS: No lymphadenopathy CARDIAC: RRR, no murmurs, rubs, gallops RESPIRATORY: Clear anteriorly,  diminished breath sounds at bases ABDOMEN: Soft, non-tender, non-distended MUSCULOSKELETAL:  No edema; right knee brace noted SKIN: Warm and dry NEUROLOGIC:  Alert and oriented x 3 PSYCHIATRIC:  Normal affect   ASSESSMENT:    1. Dyspnea on exertion   2. PAC (premature atrial contraction)    PLAN:    In order of problems listed above:  1. Patient with dyspnea on exertion, echocardiogram with normal systolic function.  Symptoms likely pulmonary in origin or deconditioning, Especially with history of OSA, ILD, home oxygen nightly.  Will obtain Lexiscan Myoview to evaluate any significant arrhythmias.   2. Cardiac monitor with frequent PACs, no significant arrhythmias.  Continue to monitor.  Patient clinically asymptomatic with no symptoms of significant palpitations.  Low normal blood pressures and heart rates preventing use of beta-blockers at this time.  Follow-up after stress testing.   Shared Decision Making/Informed Consent The risks [chest pain, shortness of breath, cardiac arrhythmias, dizziness, blood pressure fluctuations, myocardial infarction, stroke/transient ischemic attack, nausea, vomiting, allergic reaction, radiation exposure, metallic taste sensation and  life-threatening complications (estimated to be 1 in 10,000)], benefits (risk stratification, diagnosing coronary artery disease, treatment guidance) and alternatives of a nuclear stress test were discussed in detail with Brittany Browning and she agrees to proceed.   Medication Adjustments/Labs and Tests Ordered: Current medicines are reviewed at length with the patient today.  Concerns regarding medicines are outlined above.  Orders Placed This Encounter  Procedures  . NM Myocar Multi W/Spect W/Wall Motion / EF  . EKG 12-Lead   No orders of the defined types were placed in this encounter.   Patient Instructions  Medication Instructions:   Your physician recommends that you continue on your current medications as directed. Please refer to the Current Medication list given to you today.   *If you need a refill on your cardiac medications before your next appointment, please call your pharmacy*   Lab Work: None ordered  If you have labs (blood work) drawn today and your tests are completely normal, you will receive your results only by: Marland Kitchen MyChart Message (if you have MyChart) OR . A paper copy in the mail If you have any lab test that is abnormal or we need to change your treatment, we will call you to review the results.   Testing/Procedures:   [place]    Your caregiver has ordered a Stress Test with nuclear imaging. The purpose of this test is to evaluate the blood supply to your heart muscle. This procedure is referred to as a "Non-Invasive Stress Test." This is because other than having an IV started in your vein, nothing is inserted or "invades" your body. Cardiac stress tests are done to find areas of poor blood flow to the heart by determining the extent of coronary artery disease (CAD). Some patients exercise on a treadmill, which naturally increases the blood flow to your heart, while others who are  unable to walk on a treadmill due to physical limitations have a  pharmacologic/chemical stress agent called Lexiscan . This medicine will mimic walking on a treadmill by temporarily increasing your coronary blood flow.      PLEASE REPORT TO [facility] MEDICAL MALL ENTRANCE   THE VOLUNTEERS AT THE FIRST DESK WILL DIRECT YOU WHERE TO GO     *Please note: these test may take anywhere between 2-4 hours to complete       Date of Procedure:_____________________________________   Arrival Time for Procedure:______________________________    PLEASE NOTIFY THE OFFICE AT  LEAST 24 HOURS IN ADVANCE IF YOU ARE UNABLE TO KEEP YOUR APPOINTMENT.  [place] 24 HOURS IN ADVANCE IF YOU ARE UNABLE TO KEEP YOUR APPOINTMENT. [phone]       How to prepare for your Myoview test:    1. Do not eat or drink after midnight  2. No caffeine for 24 hours prior to test  3. No smoking 24 hours prior to test.  4. Unless instructed otherwise, Take your medication with a small sips of water.    5.         Ladies, please do not wear dresses. Skirts or pants are appropriate. Please wear a short sleeve shirt.  6. No perfume, cologne or lotion.  7. Wear comfortable walking shoes. No heels!     Follow-Up: At [facility], you and your health needs are our priority.  As part of our continuing mission to provide you with exceptional heart care, we have created designated Provider Care Teams.  These Care Teams include your primary Cardiologist (physician) and Advanced Practice Providers (APPs -  Physician Assistants and Nurse Practitioners) who all work together to provide you with the care you need, when you need it.  We recommend signing up for the patient portal called "MyChart".  Sign up information is provided on this After Visit Summary.  MyChart is used to connect with patients for Virtual Visits (Telemedicine).  Patients are able to view lab/test results, encounter notes, upcoming appointments, etc.  Non-urgent messages can be  sent to your provider as well.   To learn more about what you can do with MyChart, go to [email or web address].    Your next appointment:   6 week(s)  The format for your next appointment:   In Person  Provider:   Kate Sable, MD   Other Instructions      Signed, Kate Sable, MD  12/24/2020 3:36 PM    [place]

## 2021-01-10 ENCOUNTER — Encounter: Payer: Self-pay | Admitting: Ophthalmology

## 2021-01-23 ENCOUNTER — Other Ambulatory Visit: Payer: Self-pay

## 2021-01-23 ENCOUNTER — Encounter
Admission: RE | Admit: 2021-01-23 | Discharge: 2021-01-23 | Disposition: A | Discharge status: Home / Self Care | Payer: Medicare Other | Source: Ambulatory Visit | Attending: Cardiology | Admitting: Cardiology

## 2021-01-23 DIAGNOSIS — R0609 Other forms of dyspnea: Secondary | ICD-10-CM

## 2021-01-23 DIAGNOSIS — R0600 Dyspnea, unspecified: Secondary | ICD-10-CM | POA: Insufficient documentation

## 2021-01-23 LAB — NM MYOCAR MULTI W/SPECT W/WALL MOTION / EF
Estimated workload: 1 METS
Exercise duration (min): 0 min
Exercise duration (sec): 0 s
LV dias vol: 59 mL (ref 46–106)
LV sys vol: 15 mL
MPHR: 140 {beats}/min
Peak HR: 97 {beats}/min
Percent HR: 69 %
Rest BP: 132 mmHg
Rest HR: 65 {beats}/min
SDS: 4
SRS: 1
SSS: 5
TID: 1.05

## 2021-01-23 MED ORDER — TECHNETIUM TC 99M TETROFOSMIN IV KIT
32.9800 | PACK | Freq: Once | INTRAVENOUS | Status: AC | PRN
  Administered 2021-01-23: 32.98 via INTRAVENOUS

## 2021-01-23 MED ORDER — REGADENOSON 0.4 MG/5ML IV SOLN
0.4000 mg | Freq: Once | INTRAVENOUS | Status: AC
  Administered 2021-01-23: 0.4 mg via INTRAVENOUS

## 2021-01-23 MED ORDER — TECHNETIUM TC 99M TETROFOSMIN IV KIT
10.0000 | PACK | Freq: Once | INTRAVENOUS | Status: AC | PRN
  Administered 2021-01-23: 10.04 via INTRAVENOUS

## 2021-02-01 DIAGNOSIS — I83893 Varicose veins of bilateral lower extremities with other complications: Secondary | ICD-10-CM | POA: Insufficient documentation

## 2021-02-05 NOTE — Progress Notes (Signed)
Patient seen in office today by EJ with [facility] for custom orthotic measurement and casting. Patient was casted using a foam box. Patient advised that the office will call when the orthotics are available for pick-up. Patient verbalized understanding.

## 2021-02-08 ENCOUNTER — Other Ambulatory Visit: Payer: Self-pay

## 2021-02-08 ENCOUNTER — Ambulatory Visit (INDEPENDENT_AMBULATORY_CARE_PROVIDER_SITE_OTHER): Payer: Medicare Other | Admitting: Cardiology

## 2021-02-08 ENCOUNTER — Encounter: Payer: Self-pay | Admitting: Cardiology

## 2021-02-08 VITALS — BP 112/60 | HR 61 | Ht 66.0 in | Wt 149.0 lb

## 2021-02-08 DIAGNOSIS — I8393 Asymptomatic varicose veins of bilateral lower extremities: Secondary | ICD-10-CM

## 2021-02-08 DIAGNOSIS — R600 Localized edema: Secondary | ICD-10-CM

## 2021-02-08 DIAGNOSIS — R0609 Other forms of dyspnea: Secondary | ICD-10-CM

## 2021-02-08 DIAGNOSIS — I491 Atrial premature depolarization: Secondary | ICD-10-CM | POA: Diagnosis not present

## 2021-02-08 DIAGNOSIS — R0600 Dyspnea, unspecified: Secondary | ICD-10-CM | POA: Diagnosis not present

## 2021-02-08 DIAGNOSIS — Z01810 Encounter for preprocedural cardiovascular examination: Secondary | ICD-10-CM

## 2021-02-08 DIAGNOSIS — Z01818 Encounter for other preprocedural examination: Secondary | ICD-10-CM

## 2021-02-08 MED ORDER — FUROSEMIDE 20 MG PO TABS: 20.0000 mg | ORAL_TABLET | Freq: Every day | ORAL | 0 refills | Status: DC

## 2021-02-08 NOTE — Patient Instructions (Signed)
Medication Instructions:   Your physician has recommended you make the following change in your medication:    START taking Lasix 20 MG once a day.  *If you need a refill on your cardiac medications before your next appointment, please call your pharmacy*   Lab Work: None ordered If you have labs (blood work) drawn today and your tests are completely normal, you will receive your results only by:  (if you have MyChart) OR A paper copy in the mail If you have any lab test that is abnormal or we need to change your treatment, we will call you to review the results.   Testing/Procedures: None ordered   Follow-Up: At [facility], you and your health needs are our priority.  As part of our continuing mission to provide you with exceptional heart care, we have created designated Provider Care Teams.  These Care Teams include your primary Cardiologist (physician) and Advanced Practice Providers (APPs -  Physician Assistants and Nurse Practitioners) who all work together to provide you with the care you need, when you need it.  We recommend signing up for the patient portal called "MyChart".  Sign up information is provided on this After Visit Summary.  MyChart is used to connect with patients for Virtual Visits (Telemedicine).  Patients are able to view lab/test results, encounter notes, upcoming appointments, etc.  Non-urgent messages can be sent to your provider as well.   To learn more about what you can do with MyChart, go to [email or web address].    Your next appointment:   Follow up as needed   The format for your next appointment:   In Person  Provider:   Kate Sable, MD   Other Instructions

## 2021-02-08 NOTE — Progress Notes (Signed)
Cardiology Office Note:    Date:  02/08/2021   ID:  Brittany Browning, DOB June 15, 1941, MRN [id number]  PCP:  Wardell Honour, MD   [facility] Providers Cardiologist:  None     Referring MD: Wardell Honour, MD   Chief Complaint  Patient presents with   other    6 week follow up post myoview. Patient c.o swelling in ankles and her big toe hurts. Meds reviewed verbally with patient.     History of Present Illness:    Brittany Browning is a 80 y.o. female with a hx of anxiety, restless leg syndrome, OSA, mild ILD, on oxygen nightly who presents for follow up.  She was last seen due to shortness of breath and premature beats.  Previous cardiac monitor placed did not show any significant arrhythmias, clinically asymptomatic.  Echocardiogram performed at [facility] showed preserved ejection fraction.  Lexiscan Myoview was ordered to evaluate any significant arrhythmia.  Denies chest pain.  Has restless leg syndrome, started on a new medication by neurology.  Also has lower extremity edema, has tried compression stockings in the past without improvement.  Complains of big toe pain, sometimes hurts when she presses.  Planning on having cataract surgery soon.  Prior notes Echocardiogram at [facility] 08/12/3233 normal systolic function, EF 50 to 55%, Cardiac monitor at [facility] on 11/15/2020 PACs, PVCs, occasional paroxysmal SVTs.  Patient triggered events coincided with ectopy and sinus tach.  PAC burden 5.78%, PVC burden 0.62%.  Past Medical History:  Diagnosis Date   Anemia    Anxiety    Arthritis    osteoarthritis   Basal cell carcinoma (BCC)    Cataract    Complication of anesthesia    has had problems in the past. can tolerate propofol   Environmental allergies    Insomnia    Osteopenia    Restless leg syndrome    Stress incontinence    Willis-Ekbom syndrome     Past Surgical History:  Procedure Laterality Date   CATARACT EXTRACTION W/PHACO Left 10/20/2017   Procedure: CATARACT  EXTRACTION PHACO AND INTRAOCULAR LENS PLACEMENT ([place]);  Surgeon: Birder Robson, MD;  Location: [facility] ORS;  Service: Ophthalmology;  Laterality: Left;  [name] 00:39.7 AP% 12.9 CDE 5.09 Fluid Pack Lot # [id number]   COLONOSCOPY     SKIN CANCER EXCISION     TONSILLECTOMY AND ADENOIDECTOMY  1944    Current Medications: Current Meds  Medication Sig   aspirin 81 MG EC tablet Take 81 mg by mouth daily.   Azelastine HCl 0.15 % SOLN Place 542.7 application into the nose. Place 2 sprays into both nostrils nightly   Bioflavonoid Products (ESTER-C) TABS Take 1 tablet by mouth daily.   Buprenorphine HCl-Naloxone HCl 2-0.5 MG FILM buprenorphine 2 mg-naloxone 0.5 mg sublingual film   CALCIUM PO Take 1,400 mg by mouth daily.   cholecalciferol (VITAMIN D) 1000 units tablet Take 1,000 Units by mouth daily.   Cinnamon 500 MG capsule Take 2 capsules by mouth daily.    Estradiol 10 MCG TABS vaginal tablet Place 10 mcg vaginally.   ferrous sulfate (SLOW RELEASE IRON) 160 (50 Fe) MG TBCR SR tablet    fluocinonide ointment (LIDEX) 0.05 % fluocinonide 0.05 % topical ointment   folic acid (FOLVITE) 1 MG tablet Take 800 mcg by mouth daily.   furosemide (LASIX) 20 MG tablet Take 1 tablet (20 mg total) by mouth daily.   Glucosamine-Chondroitin (COSAMIN DS PO) Take 1 tablet by mouth 2 (two) times daily.  Hypertonic Nasal Wash (SINUS RINSE NA) Place 1 application into the nose daily.   ipratropium (ATROVENT HFA) 17 MCG/ACT inhaler Inhale 2 puffs into the lungs every 6 (six) hours as needed.   ketotifen (ZADITOR) 0.025 % ophthalmic solution Apply 1 drop to eye 2 (two) times daily as needed.   MAGNESIUM GLUCONATE PO Take 140 mg by mouth daily.   mirabegron ER (MYRBETRIQ) 25 MG TB24 tablet Take 25 mg by mouth daily.   montelukast (SINGULAIR) 10 MG tablet Take 1 tablet by mouth daily.   Multiple Vitamins-Minerals (PRESERVISION AREDS PO) Take by mouth daily.   NONFORMULARY OR COMPOUNDED ITEM Cream with cannabis oil    Omega-3 Fatty Acids (RA FISH OIL) 1000 MG CAPS    ondansetron (ZOFRAN-ODT) 4 MG disintegrating tablet Take 4 mg by mouth as needed for nausea or vomiting.   OXYGEN Inhale 2 L into the lungs at bedtime.   polyethylene glycol (MIRALAX / GLYCOLAX) 17 g packet Take 17 g by mouth daily.   pramipexole (MIRAPEX) 0.125 MG tablet Take 0.125 mg by mouth daily.   Probiotic Product (PROBIOTIC PO) Take by mouth daily.   Rotigotine 1 MG/24HR PT24 Place onto the skin.   sennosides-docusate sodium (SENOKOT-S) 8.6-50 MG tablet Take 1 tablet by mouth daily.   simethicone (MYLICON) 638 MG chewable tablet Chew 125 mg by mouth daily.   vitamin B-12 (CYANOCOBALAMIN) 1000 MCG tablet Take 1,000 mcg by mouth daily.     Allergies:   Lactose, Bupropion, Lactose intolerance (gi), Sulfa antibiotics, and Zolpidem   Social History   Socioeconomic History   Marital status: Widowed    Spouse name: Not on file   Number of children: Not on file   Years of education: Not on file   Highest education level: Not on file  Occupational History   Not on file  Tobacco Use   Smoking status: Never   Smokeless tobacco: Never  Vaping Use   Vaping Use: Never used  Substance and Sexual Activity   Alcohol use: No   Drug use: No   Sexual activity: Not on file  Other Topics Concern   Not on file  Social History Narrative   Widowed in 2021.   Resides in [place].   Retired.   Social Determinants of Health   Financial Resource Strain: Not on file  Food Insecurity: Not on file  Transportation Needs: Not on file  Physical Activity: Not on file  Stress: Not on file  Social Connections: Not on file     Family History: The patient's family history is negative for Breast cancer.  ROS:   Please see the history of present illness.     All other systems reviewed and are negative.  EKGs/Labs/Other Studies Reviewed:    The following studies were reviewed today:   EKG:  EKG not ordered today.   Recent Labs: No  results found for requested labs within last 8760 hours.  Recent Lipid Panel No results found for: CHOL, TRIG, HDL, CHOLHDL, VLDL, LDLCALC, LDLDIRECT   Risk Assessment/Calculations:      Physical Exam:    VS:  BP 112/60 (BP Location: Left Arm, Patient Position: Sitting, Cuff Size: Normal)   Pulse 61   Ht 5\' 6"  (1.676 m)   Wt 149 lb (67.6 kg)   SpO2 97%   BMI 24.05 kg/m     Wt Readings from Last 3 Encounters:  02/08/21 149 lb (67.6 kg)  12/24/20 151 lb (68.5 kg)  03/12/19 145 lb (65.8 kg)  GEN:  Well nourished, well developed in no acute distress HEENT: Normal NECK: No JVD; No carotid bruits LYMPHATICS: No lymphadenopathy CARDIAC: RRR, no murmurs, rubs, gallops RESPIRATORY: Clear anteriorly, diminished breath sounds at bases ABDOMEN: Soft, non-tender, non-distended MUSCULOSKELETAL:  1+ edema; varicose veins noted bilaterally SKIN: Warm and dry NEUROLOGIC:  Alert and oriented x 3 PSYCHIATRIC:  Normal affect   ASSESSMENT:    1. Dyspnea on exertion   2. PAC (premature atrial contraction)   3. Leg edema   4. Pre-op evaluation   5. Varicose veins of both lower extremities, unspecified whether complicated     PLAN:    In order of problems listed above:  Patient with dyspnea on exertion, echocardiogram with normal systolic function. [place] 01/23/2021 showed no evidence for ischemia, low risk study.  Patient made aware of results.Symptoms likely pulmonary in origin or deconditioning, Especially with history of OSA, ILD, home oxygen nightly Cardiac monitor with frequent PACs, no significant arrhythmias.  Continue to monitor.  Patient remains clinically asymptomatic  1+ edema, likely from venous insufficiency, varicose veins noted.  Leg raising while in seated position, compression stockings advised.  Trial of Lasix 20 mg daily x5 days given.  If swelling improves may continue.  Although patient can also it will likely not improve.  Will refer patient to vein and  vascular clinic. Preop eval for cataract surgery.  Low risk procedure from a cardiac perspective.  Echo with preserved EF, stress test with no evidence for ischemia.  Okay to proceed with procedure from a cardiac perspective. Varicose veins, management as #3.  Follow-up as needed     Medication Adjustments/Labs and Tests Ordered: Current medicines are reviewed at length with the patient today.  Concerns regarding medicines are outlined above.  Orders Placed This Encounter  Procedures   Ambulatory referral to Vascular Surgery    Meds ordered this encounter  Medications   furosemide (LASIX) 20 MG tablet    Sig: Take 1 tablet (20 mg total) by mouth daily.    Dispense:  30 tablet    Refill:  0     Patient Instructions  Medication Instructions:   Your physician has recommended you make the following change in your medication:    START taking Lasix 20 MG once a day.  *If you need a refill on your cardiac medications before your next appointment, please call your pharmacy*   Lab Work: None ordered If you have labs (blood work) drawn today and your tests are completely normal, you will receive your results only by: [place] (if you have MyChart) OR A paper copy in the mail If you have any lab test that is abnormal or we need to change your treatment, we will call you to review the results.   Testing/Procedures: None ordered   Follow-Up: At [facility], you and your health needs are our priority.  As part of our continuing mission to provide you with exceptional heart care, we have created designated Provider Care Teams.  These Care Teams include your primary Cardiologist (physician) and Advanced Practice Providers (APPs -  Physician Assistants and Nurse Practitioners) who all work together to provide you with the care you need, when you need it.  We recommend signing up for the patient portal called "MyChart".  Sign up information is provided on this After Visit  Summary.  MyChart is used to connect with patients for Virtual Visits (Telemedicine).  Patients are able to view lab/test results, encounter notes, upcoming appointments, etc.  Non-urgent  messages can be sent to your provider as well.   To learn more about what you can do with MyChart, go to [email or web address].    Your next appointment:   Follow up as needed   The format for your next appointment:   In Person  Provider:   Kate Sable, MD   Other Instructions    Signed, Kate Sable, MD  02/08/2021 4:53 PM    [place]

## 2021-02-19 ENCOUNTER — Encounter: Payer: Self-pay | Admitting: Ophthalmology

## 2021-02-19 ENCOUNTER — Other Ambulatory Visit: Payer: Self-pay

## 2021-02-19 ENCOUNTER — Encounter
Admission: RE | Disposition: A | Discharge status: Home / Self Care | Payer: Self-pay | Source: Home / Self Care | Attending: Ophthalmology

## 2021-02-19 ENCOUNTER — Ambulatory Visit: Payer: Medicare Other | Admitting: Anesthesiology

## 2021-02-19 ENCOUNTER — Ambulatory Visit
Admission: RE | Admit: 2021-02-19 | Discharge: 2021-02-19 | Disposition: A | Discharge status: Home / Self Care | Payer: Medicare Other | Attending: Ophthalmology | Admitting: Ophthalmology

## 2021-02-19 DIAGNOSIS — Z888 Allergy status to other drugs, medicaments and biological substances status: Secondary | ICD-10-CM | POA: Insufficient documentation

## 2021-02-19 DIAGNOSIS — H2511 Age-related nuclear cataract, right eye: Secondary | ICD-10-CM | POA: Diagnosis not present

## 2021-02-19 DIAGNOSIS — Z882 Allergy status to sulfonamides status: Secondary | ICD-10-CM | POA: Diagnosis not present

## 2021-02-19 DIAGNOSIS — Z79899 Other long term (current) drug therapy: Secondary | ICD-10-CM | POA: Insufficient documentation

## 2021-02-19 DIAGNOSIS — Z7982 Long term (current) use of aspirin: Secondary | ICD-10-CM | POA: Diagnosis not present

## 2021-02-19 DIAGNOSIS — Z9981 Dependence on supplemental oxygen: Secondary | ICD-10-CM | POA: Diagnosis not present

## 2021-02-19 HISTORY — PX: CATARACT EXTRACTION W/PHACO: SHX586

## 2021-02-19 SURGERY — PHACOEMULSIFICATION, CATARACT, WITH IOL INSERTION
Anesthesia: Monitor Anesthesia Care | Site: Eye | Laterality: Right

## 2021-02-19 MED ORDER — TETRACAINE HCL 0.5 % OP SOLN
1.0000 [drp] | OPHTHALMIC | Status: DC | PRN
  Administered 2021-02-19 (×3): 1 [drp] via OPHTHALMIC

## 2021-02-19 MED ORDER — SIGHTPATH DOSE#1 BSS IO SOLN
INTRAOCULAR | Status: DC | PRN
  Administered 2021-02-19: 15 mL

## 2021-02-19 MED ORDER — PHENYLEPHRINE HCL 10 % OP SOLN
1.0000 [drp] | OPHTHALMIC | Status: DC | PRN
  Administered 2021-02-19 (×3): 1 [drp] via OPHTHALMIC

## 2021-02-19 MED ORDER — MIDAZOLAM HCL 2 MG/2ML IJ SOLN
INTRAMUSCULAR | Status: DC | PRN
  Administered 2021-02-19: 1 mg via INTRAVENOUS

## 2021-02-19 MED ORDER — MOXIFLOXACIN HCL 0.5 % OP SOLN
OPHTHALMIC | Status: DC | PRN
  Administered 2021-02-19: 0.2 mL via OPHTHALMIC

## 2021-02-19 MED ORDER — SIGHTPATH DOSE#1 NA CHONDROIT SULF-NA HYALURON 40-17 MG/ML IO SOLN
INTRAOCULAR | Status: DC | PRN
  Administered 2021-02-19: 1 mL via INTRAOCULAR

## 2021-02-19 MED ORDER — BRIMONIDINE TARTRATE-TIMOLOL 0.2-0.5 % OP SOLN
OPHTHALMIC | Status: DC | PRN
  Administered 2021-02-19: 1 [drp] via OPHTHALMIC

## 2021-02-19 MED ORDER — SIGHTPATH DOSE#1 BSS IO SOLN
INTRAOCULAR | Status: DC | PRN
  Administered 2021-02-19: 38 mL via OPHTHALMIC

## 2021-02-19 MED ORDER — LACTATED RINGERS IV SOLN: INTRAVENOUS | Status: DC

## 2021-02-19 MED ORDER — SIGHTPATH DOSE#1 BSS IO SOLN
INTRAOCULAR | Status: DC | PRN
  Administered 2021-02-19: 1 mL

## 2021-02-19 MED ORDER — CYCLOPENTOLATE HCL 2 % OP SOLN
1.0000 [drp] | OPHTHALMIC | Status: DC | PRN
  Administered 2021-02-19 (×3): 1 [drp] via OPHTHALMIC

## 2021-02-19 MED ORDER — FENTANYL CITRATE (PF) 100 MCG/2ML IJ SOLN
INTRAMUSCULAR | Status: DC | PRN
  Administered 2021-02-19: 50 ug via INTRAVENOUS

## 2021-02-19 SURGICAL SUPPLY — 16 items
CANNULA ANT/CHMB 27GA (MISCELLANEOUS) ×4 IMPLANT
GLOVE SURG ENC TEXT LTX SZ8 (GLOVE) ×4 IMPLANT
GLOVE SURG TRIUMPH 8.0 PF LTX (GLOVE) ×2 IMPLANT
GOWN STRL REUS W/ TWL LRG LVL3 (GOWN DISPOSABLE) ×2 IMPLANT
GOWN STRL REUS W/TWL LRG LVL3 (GOWN DISPOSABLE) ×4
LENS IOL TECNIS EYHANCE 19.5 (Intraocular Lens) ×2 IMPLANT
MARKER SKIN DUAL TIP RULER LAB (MISCELLANEOUS) ×2 IMPLANT
NEEDLE FILTER BLUNT 18X 1/2SAF (NEEDLE) ×1
NEEDLE FILTER BLUNT 18X1 1/2 (NEEDLE) ×1 IMPLANT
PACK EYE AFTER SURG (MISCELLANEOUS) ×2 IMPLANT
SUT ETHILON 10-0 CS-B-6CS-B-6 (SUTURE)
SUTURE EHLN 10-0 CS-B-6CS-B-6 (SUTURE) IMPLANT
SYR 3ML LL SCALE MARK (SYRINGE) ×2 IMPLANT
SYR TB 1ML LUER SLIP (SYRINGE) ×2 IMPLANT
WATER STERILE IRR 250ML POUR (IV SOLUTION) ×2 IMPLANT
WIPE NON LINTING 3.25X3.25 (MISCELLANEOUS) ×2 IMPLANT

## 2021-02-19 NOTE — Anesthesia Postprocedure Evaluation (Signed)
Anesthesia Post Note  Patient: Brittany Browning  Procedure(s) Performed: CATARACT EXTRACTION PHACO AND INTRAOCULAR LENS PLACEMENT (IOC) RIGHT (Right: Eye)     Patient location during evaluation: PACU Anesthesia Type: MAC Level of consciousness: awake and alert Pain management: pain level controlled Vital Signs Assessment: post-procedure vital signs reviewed and stable Respiratory status: spontaneous breathing and nonlabored ventilation Cardiovascular status: blood pressure returned to baseline Postop Assessment: no apparent nausea or vomiting Anesthetic complications: no   No notable events documented.  Thoams Siefert [employer]

## 2021-02-19 NOTE — H&P (Signed)
[facility]   Primary Care Physician:  Wardell Honour, MD Ophthalmologist: Dr. George Ina  Pre-Procedure History & Physical: HPI:  Brittany Browning is a 80 y.o. female here for cataract surgery.   Past Medical History:  Diagnosis Date   Anemia    Anxiety    Arthritis    osteoarthritis   Basal cell carcinoma (BCC)    Cataract    Complication of anesthesia    has had problems in the past. can tolerate propofol   Environmental allergies    Insomnia    Osteopenia    Restless leg syndrome    Stress incontinence    Willis-Ekbom syndrome     Past Surgical History:  Procedure Laterality Date   CATARACT EXTRACTION W/PHACO Left 10/20/2017   Procedure: CATARACT EXTRACTION PHACO AND INTRAOCULAR LENS PLACEMENT ([place]);  Surgeon: Birder Robson, MD;  Location: [facility] ORS;  Service: Ophthalmology;  Laterality: Left;  [name] 00:39.7 AP% 12.9 CDE 5.09 Fluid Pack Lot # [id number]   COLONOSCOPY     SKIN CANCER EXCISION     TONSILLECTOMY AND ADENOIDECTOMY  1944    Prior to Admission medications   Medication Sig Start Date End Date Taking? Authorizing Provider  aspirin 81 MG EC tablet Take 81 mg by mouth daily.   Yes [provider]  Azelastine HCl 0.15 % SOLN Place 182.9 application into the nose. Place 2 sprays into both nostrils nightly 02/06/16  Yes [provider]  Bioflavonoid Products (ESTER-C) TABS Take 1 tablet by mouth daily.   Yes [provider]  Buprenorphine HCl-Naloxone HCl 2-0.5 MG FILM buprenorphine 2 mg-naloxone 0.5 mg sublingual film 10/11/20  Yes [provider]  CALCIUM PO Take 1,400 mg by mouth daily.   Yes [provider]  cholecalciferol (VITAMIN D) 1000 units tablet Take 1,000 Units by mouth daily.   Yes [provider]  Cinnamon 500 MG capsule Take 2 capsules by mouth daily.    Yes [provider]  Estradiol 10 MCG TABS vaginal tablet Place 10 mcg vaginally.   Yes [provider]  ferrous sulfate  (SLOW RELEASE IRON) 160 (50 Fe) MG TBCR SR tablet  08/18/16  Yes [provider]  folic acid (FOLVITE) 1 MG tablet Take 800 mcg by mouth daily. 10/18/07  Yes [provider]  furosemide (LASIX) 20 MG tablet Take 1 tablet (20 mg total) by mouth daily. 02/08/21 05/09/21 Yes Agbor-Etang, Aaron Edelman, MD  Glucosamine-Chondroitin (COSAMIN DS PO) Take 1 tablet by mouth 2 (two) times daily.    Yes [provider]  Hypertonic Nasal Wash (SINUS RINSE NA) Place 1 application into the nose daily.   Yes [provider]  ipratropium (ATROVENT HFA) 17 MCG/ACT inhaler Inhale 2 puffs into the lungs every 6 (six) hours as needed.   Yes [provider]  levocetirizine (XYZAL) 5 MG tablet Take 1 tablet by mouth daily with breakfast.  02/06/16 02/19/21 Yes [provider]  MAGNESIUM GLUCONATE PO Take 140 mg by mouth daily.   Yes [provider]  mirabegron ER (MYRBETRIQ) 25 MG TB24 tablet Take 25 mg by mouth daily.   Yes [provider]  montelukast (SINGULAIR) 10 MG tablet Take 1 tablet by mouth daily. 10/14/10  Yes [provider]  Multiple Vitamins-Minerals (PRESERVISION AREDS PO) Take by mouth daily.   Yes [provider]  NONFORMULARY OR COMPOUNDED ITEM Cream with cannabis oil   Yes [provider]  Omega-3 Fatty Acids (RA FISH OIL) 1000 MG CAPS  Yes [provider]  OXYGEN Inhale 2 L into the lungs at bedtime.   Yes [provider]  polyethylene glycol (MIRALAX / GLYCOLAX) 17 g packet Take 17 g by mouth daily.   Yes [provider]  pramipexole (MIRAPEX) 0.125 MG tablet Take 0.125 mg by mouth daily.   Yes [provider]  Probiotic Product (PROBIOTIC PO) Take by mouth daily.   Yes [provider]  Rotigotine 1 MG/24HR PT24 Place onto the skin.   Yes [provider]  sennosides-docusate sodium (SENOKOT-S) 8.6-50 MG tablet Take 1 tablet by mouth daily.   Yes [provider]  simethicone (MYLICON) 694 MG chewable tablet Chew 125 mg by mouth daily.   Yes [provider]  triamcinolone (NASACORT) 55 MCG/ACT AERO nasal inhaler Place into the nose. 08/18/16 02/19/21 Yes [provider]  vitamin B-12 (CYANOCOBALAMIN) 1000 MCG tablet Take 1,000 mcg by mouth daily.   Yes [provider]  fluocinonide ointment (LIDEX) 0.05 % fluocinonide 0.05 % topical ointment 09/29/17   [provider]  ketotifen (ZADITOR) 0.025 % ophthalmic solution Apply 1 drop to eye 2 (two) times daily as needed.    [provider]  ondansetron (ZOFRAN-ODT) 4 MG disintegrating tablet Take 4 mg by mouth as needed for nausea or vomiting.    [provider]    Allergies as of 12/04/2020 - Review Complete 06/08/2020  Allergen Reaction Noted   Lactose Other (See Comments) 10/01/2007   Bupropion Anxiety and Other (See Comments) 06/03/2017   Lactose intolerance (gi) Other (See Comments) 10/06/2017   Sulfa antibiotics Other (See Comments) 12/31/2016   Zolpidem Other (See Comments) 10/06/2017    Family History  Problem Relation Age of Onset   Breast cancer Neg Hx     Social History   Socioeconomic History   Marital status: Widowed    Spouse name: Not on file   Number of children: Not on file   Years of education: Not on file   Highest education level: Not on file  Occupational History   Not on file  Tobacco Use   Smoking status: Never   Smokeless tobacco: Never  Vaping Use   Vaping Use: Never used  Substance and Sexual Activity   Alcohol use: No   Drug use: No   Sexual activity: Not on file  Other Topics Concern   Not on file  Social History Narrative   Widowed in 2021.   Resides in [place].   Retired.   Social Determinants of Health   Financial Resource Strain: Not on file  Food Insecurity: Not on file  Transportation Needs: Not on file  Physical Activity: Not on file  Stress: Not on file  Social  Connections: Not on file  Intimate Partner Violence: Not on file    Review of Systems: See HPI, otherwise negative ROS  Physical Exam: BP 121/71   Pulse 76   Temp 98.4 F (36.9 C)   Ht 5\' 6"  (1.676 m)   Wt 66.7 kg   SpO2 94%   BMI 23.73 kg/m  General:   Alert, cooperative in NAD Head:  Normocephalic and atraumatic. Respiratory:  Normal work of breathing. Cardiovascular:  RRR  Impression/Plan: Brittany Browning is here for cataract surgery.  Risks, benefits, limitations, and alternatives regarding cataract surgery have been reviewed with the patient.  Questions have been answered.  All parties agreeable.   Birder Robson, MD  02/19/2021, 8:42 AM

## 2021-02-19 NOTE — Transfer of Care (Signed)
Immediate Anesthesia Transfer of Care Note  Patient: Brittany Browning  Procedure(s) Performed: CATARACT EXTRACTION PHACO AND INTRAOCULAR LENS PLACEMENT (IOC) RIGHT (Right: Eye)  Patient Location: PACU  Anesthesia Type: MAC  Level of Consciousness: awake, alert  and patient cooperative  Airway and Oxygen Therapy: Patient Spontanous Breathing   Post-op Assessment: Post-op Vital signs reviewed, Patient's Cardiovascular Status Stable, Respiratory Function Stable, Patent Airway and No signs of Nausea or vomiting  Post-op Vital Signs: Reviewed and stable  Complications: No notable events documented.

## 2021-02-19 NOTE — Anesthesia Preprocedure Evaluation (Addendum)
Anesthesia Evaluation  Patient identified by MRN, date of birth, ID band Patient awake    Reviewed: Allergy & Precautions, NPO status , Patient's Chart, lab work & pertinent test results  Airway Mallampati: II  TM Distance: >3 FB Neck ROM: Full    Dental no notable dental hx.    Pulmonary sleep apnea (does not consistently wear CPAP), Continuous Positive Airway Pressure Ventilation and Oxygen sleep apnea ,  2L oxygen via [place] at night   breath sounds clear to auscultation- rhonchi (-) wheezing      Cardiovascular Exercise Tolerance: Good  Rhythm:Regular Rate:Normal - Systolic murmurs and - Diastolic murmurs    Neuro/Psych PSYCHIATRIC DISORDERS Anxiety Depression Takes suboxone for RLS  Neuromuscular disease    GI/Hepatic negative GI ROS, Neg liver ROS,   Endo/Other  negative endocrine ROS  Renal/GU negative Renal ROS     Musculoskeletal  (+) Arthritis ,   Abdominal Normal abdominal exam  (+) - obese,   Peds  Hematology  (+) anemia ,   Anesthesia Other Findings Willis-Ekbom syndrome  Reproductive/Obstetrics                            Anesthesia Physical  Anesthesia Plan  ASA: 3  Anesthesia Plan: MAC   Post-op Pain Management:    Induction: Intravenous  PONV Risk Score and Plan: 2 and Midazolam, TIVA and Treatment may vary due to age or medical condition  Airway Management Planned: Natural Airway and Nasal Cannula  Additional Equipment:   Intra-op Plan:   Post-operative Plan:   Informed Consent: I have reviewed the patients History and Physical, chart, labs and discussed the procedure including the risks, benefits and alternatives for the proposed anesthesia with the patient or authorized representative who has indicated his/her understanding and acceptance.     Dental advisory given  Plan Discussed with: CRNA  Anesthesia Plan Comments:        Anesthesia Quick  Evaluation

## 2021-02-19 NOTE — Op Note (Signed)
PREOPERATIVE DIAGNOSIS:  Nuclear sclerotic cataract of the right eye.   POSTOPERATIVE DIAGNOSIS:  Cataract   OPERATIVE PROCEDURE:ORPROCALL@   SURGEON:  Birder Robson, MD.   ANESTHESIA:  Anesthesiologist: Ardeth Sportsman, MD CRNA: Vanetta Shawl, CRNA  1.      Managed anesthesia care. 2.      0.24ml of Shugarcaine was instilled in the eye following the paracentesis.   COMPLICATIONS:  None.   TECHNIQUE:   Stop and chop   DESCRIPTION OF PROCEDURE:  The patient was examined and consented in the preoperative holding area where the aforementioned topical anesthesia was applied to the right eye and then brought back to the Operating Room where the right eye was prepped and draped in the usual sterile ophthalmic fashion and a lid speculum was placed. A paracentesis was created with the side port blade and the anterior chamber was filled with viscoelastic. A near clear corneal incision was performed with the steel keratome. A continuous curvilinear capsulorrhexis was performed with a cystotome followed by the capsulorrhexis forceps. Hydrodissection and hydrodelineation were carried out with BSS on a blunt cannula. The lens was removed in a stop and chop  technique and the remaining cortical material was removed with the irrigation-aspiration handpiece. The capsular bag was inflated with viscoelastic and the Technis [id number]  lens was placed in the capsular bag without complication. The remaining viscoelastic was removed from the eye with the irrigation-aspiration handpiece. The wounds were hydrated. The anterior chamber was flushed with BSS and the eye was inflated to physiologic pressure. 0.3ml of Vigamox was placed in the anterior chamber. The wounds were found to be water tight. The eye was dressed with Combigan. The patient was given protective glasses to wear throughout the day and a shield with which to sleep tonight. The patient was also given drops with which to begin a drop regimen today and will  follow-up with me in one day. Implant Name Type Inv. Item Serial No. Manufacturer Lot No. LRB No. Used Action  LENS IOL TECNIS EYHANCE 19.5 - [id number] Intraocular Lens LENS IOL TECNIS EYHANCE 19.5 [id number] JOHNSON   Right 1 Implanted   Procedure(s) with comments: CATARACT EXTRACTION PHACO AND INTRAOCULAR LENS PLACEMENT (IOC) RIGHT (Right) - 6.15 0:38.6  Electronically signed: Birder Robson 02/19/2021 9:06 AM

## 2021-02-19 NOTE — Anesthesia Procedure Notes (Signed)
Procedure Name: MAC Date/Time: 02/19/2021 8:51 AM Performed by: Vanetta Shawl, CRNA Pre-anesthesia Checklist: Patient identified, Emergency Drugs available, Suction available, Timeout performed and Patient being monitored Patient Re-evaluated:Patient Re-evaluated prior to induction Oxygen Delivery Method: Nasal cannula Placement Confirmation: positive ETCO2

## 2021-02-19 NOTE — Discharge Instructions (Signed)

## 2021-02-20 ENCOUNTER — Encounter: Payer: Self-pay | Admitting: Ophthalmology

## 2021-03-04 ENCOUNTER — Other Ambulatory Visit (INDEPENDENT_AMBULATORY_CARE_PROVIDER_SITE_OTHER): Payer: Self-pay | Admitting: Nurse Practitioner

## 2021-03-04 DIAGNOSIS — I83899 Varicose veins of unspecified lower extremities with other complications: Secondary | ICD-10-CM

## 2021-03-06 ENCOUNTER — Ambulatory Visit (INDEPENDENT_AMBULATORY_CARE_PROVIDER_SITE_OTHER): Payer: Medicare Other

## 2021-03-06 ENCOUNTER — Other Ambulatory Visit: Payer: Self-pay

## 2021-03-06 ENCOUNTER — Ambulatory Visit (INDEPENDENT_AMBULATORY_CARE_PROVIDER_SITE_OTHER): Payer: Medicare Other | Admitting: Nurse Practitioner

## 2021-03-06 ENCOUNTER — Encounter (INDEPENDENT_AMBULATORY_CARE_PROVIDER_SITE_OTHER): Payer: Self-pay | Admitting: Nurse Practitioner

## 2021-03-06 VITALS — BP 131/76 | HR 78 | Resp 16 | Ht 66.0 in | Wt 147.0 lb

## 2021-03-06 DIAGNOSIS — I83899 Varicose veins of unspecified lower extremities with other complications: Secondary | ICD-10-CM

## 2021-03-06 DIAGNOSIS — M1990 Unspecified osteoarthritis, unspecified site: Secondary | ICD-10-CM

## 2021-03-06 DIAGNOSIS — I83893 Varicose veins of bilateral lower extremities with other complications: Secondary | ICD-10-CM

## 2021-03-06 NOTE — Patient Instructions (Addendum)
Compression Socks  The patient is to wear medical grade one graduate compression stockings (20-30 mmHg) on a daily basis.  The socks should be placed first thing in the morning and removed in the evening The patient was instructed specifically not to sleep in the socks. Socks should be replaced on an annual basis   Elevation The patient was encouraged to elevate the bilateral lower extremity as often as possible.  Proper elevation is heart level or higher  This includes elevation during the day

## 2021-03-06 NOTE — Progress Notes (Signed)
Subjective:    Patient ID: Brittany Browning, female    DOB: September 05, 1940, 80 y.o.   MRN: [id number] Chief Complaint  Patient presents with   New Patient (Initial Visit)    Ref Agbor-etang varicose veins with pain and swelling    Brittany Browning is an 80 year old woman that presents today for evaluation after being referred by her cardiologist Dr. Garen Lah.  The patient describes swelling in her knees and ankles.  The swelling does not always occur consistently together.  She notes that it may begin on the right and that it moves to the left.  Or it may start in the knee and moved to the ankle.  The patient notes that she does not have very much swelling of the leg itself more so in these joint areas.  She also notes some pain in her right great toe.  The patient does have a notable history for restless leg syndrome.  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  The patient does note pain within the knees and ankle area.  She also has discomfort when she is working with physical therapy.  She does not describe claudication-like symptoms.  Today noninvasive symptoms no evidence of DVT or superficial thrombophlebitis.  There is no evidence of deep venous insufficiency seen bilaterally.  No evidence of superficial venous reflux seen in the left lower extremity.  Right lower extremity does have evidence of reflux in the great saphenous vein at the saphenofemoral junction.   Review of Systems  Musculoskeletal:  Positive for arthralgias and joint swelling.  All other systems reviewed and are negative.     Objective:   Physical Exam Vitals reviewed.  HENT:     Head: Normocephalic.  Cardiovascular:     Rate and Rhythm: Normal rate.     Pulses:          Dorsalis pedis pulses are 2+ on the right side and 2+ on the left side.       Posterior tibial pulses are 1+ on the right side and 1+ on the left side.  Pulmonary:     Effort: Pulmonary  effort is normal.  Musculoskeletal:     Comments: Edema present bilaterally knees and ankles  Skin:    Comments: Scattered varicosities bilaterally  Neurological:     Mental Status: She is alert and oriented to person, place, and time.  Psychiatric:        Mood and Affect: Mood normal.        Behavior: Behavior normal.        Thought Content: Thought content normal.        Judgment: Judgment normal.    BP 131/76 (BP Location: Right Arm)   Pulse 78   Resp 16   Ht '5\' 6"'$  (1.676 m)   Wt 147 lb (66.7 kg)   BMI 23.73 kg/m   Past Medical History:  Diagnosis Date   Anemia    Anxiety    Arthritis    osteoarthritis   Basal cell carcinoma (BCC)    Cataract    Complication of anesthesia    has had problems in the past. can tolerate propofol   Environmental allergies    Insomnia    Osteopenia    Restless leg syndrome    Stress incontinence    Willis-Ekbom syndrome     Social History   Socioeconomic History   Marital status: Widowed    Spouse name: Not on file  Number of children: Not on file   Years of education: Not on file   Highest education level: Not on file  Occupational History   Not on file  Tobacco Use   Smoking status: Never   Smokeless tobacco: Never  Vaping Use   Vaping Use: Never used  Substance and Sexual Activity   Alcohol use: No   Drug use: No   Sexual activity: Not on file  Other Topics Concern   Not on file  Social History Narrative   Widowed in 2021.   Resides in [place].   Retired.   Social Determinants of Health   Financial Resource Strain: Not on file  Food Insecurity: Not on file  Transportation Needs: Not on file  Physical Activity: Not on file  Stress: Not on file  Social Connections: Not on file  Intimate Partner Violence: Not on file    Past Surgical History:  Procedure Laterality Date   CATARACT EXTRACTION W/PHACO Left 10/20/2017   Procedure: CATARACT EXTRACTION PHACO AND INTRAOCULAR LENS PLACEMENT ([place]);  Surgeon:  Birder Robson, MD;  Location: [facility];  Service: Ophthalmology;  Laterality: Left;  [name] 00:39.7 AP% 12.9 CDE 5.09 Fluid Pack Lot # [id number]   CATARACT EXTRACTION W/PHACO Right 02/19/2021   Procedure: CATARACT EXTRACTION PHACO AND INTRAOCULAR LENS PLACEMENT ([place]) RIGHT;  Surgeon: Birder Robson, MD;  Location: [facility];  Service: Ophthalmology;  Laterality: Right;  6.15 0:38.6   COLONOSCOPY     SKIN CANCER EXCISION     TONSILLECTOMY AND ADENOIDECTOMY  1944    Family History  Problem Relation Age of Onset   Varicose Veins Mother    Stroke Maternal Grandfather    Breast cancer Neg Hx     Allergies  Allergen Reactions   Lactose Other (See Comments)   Bupropion Anxiety and Other (See Comments)    Aggitation, nausea, light headed   Lactose Intolerance (Gi) Other (See Comments)    GI symptoms   Sulfa Antibiotics Other (See Comments)    Reaction in the past (unknown)   Zolpidem Other (See Comments)    depression    No flowsheet data found.    CMP  No results found for: NA, K, CL, CO2, GLUCOSE, BUN, CREATININE, CALCIUM, PROT, ALBUMIN, AST, ALT, ALKPHOS, BILITOT, GFRNONAA, GFRAA   No results found.     Assessment & Plan:   1. Varicose veins of bilateral lower extremities with other complications Today the patient's noninvasive studies only show a small amount of reflux in the right great saphenous vein at the saphenofemoral junction.  Based upon the patient's symptoms, venous insufficiency is not a likely source due to the swelling occurring in both of her lower extremities.  Additionally, the swelling is not actually taking place in her legs but more so the joints, per the patient's description.  Based on this I feel that she is likely having issues more so with osteoarthritis then with general leg edema caused from lymphedema.  2. Osteoarthritis, unspecified osteoarthritis type, unspecified site Based upon the patient's description of symptoms, it is likely  that she is suffering from arthritic changes.  The patient's biggest complaints of swelling occur in her knees as well as her ankle areas.  She denies issues with swelling in the legs at home.  The pain that occurs is not in her legs but more so in the joint areas.  Based on this previous discussion patient's has had with her primary care provider, we will submit the patient to rheumatology for  further work-up for possible systemic rheumatological process. - Ambulatory referral to Rheumatology   Current Outpatient Medications on File Prior to Visit  Medication Sig Dispense Refill   aspirin 81 MG EC tablet Take 81 mg by mouth daily.     Azelastine HCl 0.15 % SOLN Place [id number] application into the nose. Place 2 sprays into both nostrils nightly     Bioflavonoid Products (ESTER-C) TABS Take 1 tablet by mouth daily.     Buprenorphine HCl-Naloxone HCl 2-0.5 MG FILM buprenorphine 2 mg-naloxone 0.5 mg sublingual film     CALCIUM PO Take 1,400 mg by mouth daily.     cholecalciferol (VITAMIN D) 1000 units tablet Take 1,000 Units by mouth daily.     Cinnamon 500 MG capsule Take 2 capsules by mouth daily.      dipyridamole (PERSANTINE) 50 MG tablet      Estradiol 10 MCG TABS vaginal tablet Place 10 mcg vaginally.     fluocinonide ointment (LIDEX) 0.05 % fluocinonide 0.05 % topical ointment     folic acid (FOLVITE) 1 MG tablet Take 800 mcg by mouth daily.     furosemide (LASIX) 20 MG tablet Take 1 tablet (20 mg total) by mouth daily. 30 tablet 0   Glucosamine-Chondroitin (COSAMIN DS PO) Take 1 tablet by mouth 2 (two) times daily.      Hypertonic Nasal Wash (SINUS RINSE NA) Place 1 application into the nose daily.     ketotifen (ZADITOR) 0.025 % ophthalmic solution Apply 1 drop to eye 2 (two) times daily as needed.     MAGNESIUM GLUCONATE PO Take 140 mg by mouth daily.     montelukast (SINGULAIR) 10 MG tablet Take 1 tablet by mouth daily.     Multiple Vitamins-Minerals (PRESERVISION AREDS PO) Take by mouth  daily.     NONFORMULARY OR COMPOUNDED ITEM Cream with cannabis oil     ondansetron (ZOFRAN-ODT) 4 MG disintegrating tablet Take 4 mg by mouth as needed for nausea or vomiting.     OXYGEN Inhale 2 L into the lungs at bedtime.     pramipexole (MIRAPEX) 0.125 MG tablet Take 0.125 mg by mouth daily.     Probiotic Product (PROBIOTIC PO) Take by mouth daily.     Rotigotine 1 MG/24HR PT24 Place onto the skin.     sennosides-docusate sodium (SENOKOT-S) 8.6-50 MG tablet Take 1 tablet by mouth daily.     simethicone (MYLICON) 0000000 MG chewable tablet Chew 125 mg by mouth daily.     vitamin B-12 (CYANOCOBALAMIN) 1000 MCG tablet Take 1,000 mcg by mouth daily.     ferrous sulfate (SLOW RELEASE IRON) 160 (50 Fe) MG TBCR SR tablet      ipratropium (ATROVENT HFA) 17 MCG/ACT inhaler Inhale 2 puffs into the lungs every 6 (six) hours as needed.     levocetirizine (XYZAL) 5 MG tablet Take 1 tablet by mouth daily with breakfast.      mirabegron ER (MYRBETRIQ) 25 MG TB24 tablet Take 25 mg by mouth daily.     Omega-3 Fatty Acids (RA FISH OIL) 1000 MG CAPS      polyethylene glycol (MIRALAX / GLYCOLAX) 17 g packet Take 17 g by mouth daily.     triamcinolone (NASACORT) 55 MCG/ACT AERO nasal inhaler Place into the nose.     No current facility-administered medications on file prior to visit.    Patient Instructions  Compression Socks  The patient is to wear medical grade one graduate compression stockings (20-30 mmHg) on a daily basis.  The socks should be placed first thing in the morning and removed in the evening The patient was instructed specifically not to sleep in the socks. Socks should be replaced on an annual basis   Elevation The patient was encouraged to elevate the bilateral lower extremity as often as possible.  Proper elevation is heart level or higher  This includes elevation during the day    No follow-ups on file.   Kris Hartmann, NP

## 2021-03-08 ENCOUNTER — Other Ambulatory Visit: Payer: Self-pay

## 2021-03-08 MED ORDER — FUROSEMIDE 20 MG PO TABS: 20.0000 mg | ORAL_TABLET | Freq: Every day | ORAL | 0 refills | Status: DC

## 2021-03-29 DIAGNOSIS — S92345D Nondisplaced fracture of fourth metatarsal bone, left foot, subsequent encounter for fracture with routine healing: Secondary | ICD-10-CM | POA: Insufficient documentation

## 2021-03-29 DIAGNOSIS — M25562 Pain in left knee: Secondary | ICD-10-CM | POA: Insufficient documentation

## 2021-04-16 DIAGNOSIS — S92413A Displaced fracture of proximal phalanx of unspecified great toe, initial encounter for closed fracture: Secondary | ICD-10-CM | POA: Insufficient documentation

## 2021-04-19 DIAGNOSIS — M159 Polyosteoarthritis, unspecified: Secondary | ICD-10-CM | POA: Insufficient documentation

## 2021-05-07 ENCOUNTER — Other Ambulatory Visit: Payer: Self-pay

## 2021-05-07 ENCOUNTER — Encounter: Payer: Medicare Other | Admitting: Dermatology

## 2021-05-07 ENCOUNTER — Ambulatory Visit (INDEPENDENT_AMBULATORY_CARE_PROVIDER_SITE_OTHER): Payer: Medicare Other | Admitting: Dermatology

## 2021-05-07 DIAGNOSIS — D2362 Other benign neoplasm of skin of left upper limb, including shoulder: Secondary | ICD-10-CM

## 2021-05-07 DIAGNOSIS — L853 Xerosis cutis: Secondary | ICD-10-CM

## 2021-05-07 DIAGNOSIS — L820 Inflamed seborrheic keratosis: Secondary | ICD-10-CM | POA: Diagnosis not present

## 2021-05-07 DIAGNOSIS — D0371 Melanoma in situ of right lower limb, including hip: Secondary | ICD-10-CM | POA: Diagnosis not present

## 2021-05-07 DIAGNOSIS — I8310 Varicose veins of unspecified lower extremity with inflammation: Secondary | ICD-10-CM | POA: Diagnosis not present

## 2021-05-07 DIAGNOSIS — R238 Other skin changes: Secondary | ICD-10-CM | POA: Diagnosis not present

## 2021-05-07 DIAGNOSIS — D039 Melanoma in situ, unspecified: Secondary | ICD-10-CM

## 2021-05-07 HISTORY — DX: Melanoma in situ, unspecified: D03.9

## 2021-05-07 NOTE — Patient Instructions (Signed)

## 2021-05-07 NOTE — Progress Notes (Signed)
Follow-Up Visit   Subjective  Brittany Browning is a 80 y.o. female who presents for the following: New Patient (Initial Visit) (Patient here today to reestablish care. Patient here today concerning 2 spots on right leg for some time, 1 spot at left arm , possible fungus at right eyebrow, red spot on lip, prescription of excipial or panoxyl. )    The following portions of the chart were reviewed this encounter and updated as appropriate:      Review of Systems: No other skin or systemic complaints except as noted in HPI or Assessment and Plan.   Objective  Well appearing patient in no apparent distress; mood and affect are within normal limits.  A focused examination was performed including face, arms, legs, back . Relevant physical exam findings are noted in the Assessment and Plan.  right pretibia x 1, right medial eyebrow x 1 (2) Erythematous keratotic or waxy stuck-on papule  face and body Xerosis   central upper lip 2 mm soft blue papule   right medial calf 4 mm slight irregular brown macule   Assessment & Plan  Inflamed seborrheic keratosis right pretibia x 1, right medial eyebrow x 1  Destruction of lesion - right pretibia x 1, right medial eyebrow x 1  Destruction method: cryotherapy   Informed consent: discussed and consent obtained   Lesion destroyed using liquid nitrogen: Yes   Region frozen until ice ball extended beyond lesion: Yes   Outcome: patient tolerated procedure well with no complications   Post-procedure details: wound care instructions given   Additional details:  Prior to procedure, discussed risks of blister formation, small wound, skin dyspigmentation, or rare scar following cryotherapy. Recommend Vaseline ointment to treated areas while healing.   Xerosis cutis face and body  Recommend mild soap and moisturizing cream 1-2 times daily.  Gentle skin care handout provided.   Recommend dove sensitive skin   Recommend cerave sa cream -  samples given in office.   Venous lake central upper lip  Benign, observe.    Neoplasm of uncertain behavior right medial calf  Epidermal / dermal shaving  Lesion diameter (cm):  0.6 Informed consent: discussed and consent obtained   Patient was prepped and draped in usual sterile fashion: Area prepped with alcohol. Anesthesia: the lesion was anesthetized in a standard fashion   Anesthetic:  1% lidocaine w/ epinephrine 1-100,000 buffered w/ 8.4% NaHCO3 Instrument used: flexible razor blade   Hemostasis achieved with: pressure, aluminum chloride and electrodesiccation   Outcome: patient tolerated procedure well   Post-procedure details: wound care instructions given   Post-procedure details comment:  Ointment and small bandage applied.   Specimen 1 - Surgical pathology Differential Diagnosis: r/o nevus vs dysplasia   Check Margins: No  R/o nevus vs dysplastic nevus  Varicose Veins/Spider Veins - Dilated blue, purple or red veins at the lower extremities - Reassured - Smaller vessels can be treated by sclerotherapy (a procedure to inject a medicine into the veins to make them disappear) if desired, but the treatment is not covered by insurance. Larger vessels may be covered if symptomatic and we would refer to vascular surgeon if treatment desired.  Dermatofibroma - Firm pink/brown papulenodule with dimple sign Left posterior upper arm - Benign growth possibly related to trauma, such as an insect bite.  Discussed removal (shave vrs excision), resulting scar and risk of recurrence. Since not bothersome, will observe for now.  - Call for any changes    Return for 2 month isk  follow up. I, Ruthell Rummage, CMA, am acting as scribe for Brendolyn Patty, MD.  Documentation: I have reviewed the above documentation for accuracy and completeness, and I agree with the above.  Brendolyn Patty MD

## 2021-05-07 NOTE — Patient Instructions (Signed)
Biopsy Wound Care Instructions  Leave the original bandage on for 24 hours if possible.  If the bandage becomes soaked or soiled before that time, it is OK to remove it and examine the wound.  A small amount of post-operative bleeding is normal.  If excessive bleeding occurs, remove the bandage, place gauze over the site and apply continuous pressure (no peeking) over the area for 30 minutes. If this does not work, please call our clinic as soon as possible or page your doctor if it is after hours.   Once a day, cleanse the wound with soap and water. It is fine to shower. If a thick crust develops you may use a Q-tip dipped into dilute hydrogen peroxide (mix 1:1 with water) to dissolve it.  Hydrogen peroxide can slow the healing process, so use it only as needed.    After washing, apply petroleum jelly (Vaseline) or an antibiotic ointment if your doctor prescribed one for you, followed by a bandage.    For best healing, the wound should be covered with a layer of ointment at all times. If you are not able to keep the area covered with a bandage to hold the ointment in place, this may mean re-applying the ointment several times a day.  Continue this wound care until the wound has healed and is no longer open.   Itching and mild discomfort is normal during the healing process. However, if you develop pain or severe itching, please call our office.   If you have any discomfort, you can take Tylenol (acetaminophen) or ibuprofen as directed on the bottle. (Please do not take these if you have an allergy to them or cannot take them for another reason).  Some redness, tenderness and white or yellow material in the wound is normal healing.  If the area becomes very sore and red, or develops a thick yellow-green material (pus), it may be infected; please notify us.    If you have stitches, return to clinic as directed to have the stitches removed. You will continue wound care for 2-3 days after the stitches  are removed.   Wound healing continues for up to one year following surgery. It is not unusual to experience pain in the scar from time to time during the interval.  If the pain becomes severe or the scar thickens, you should notify the office.    A slight amount of redness in a scar is expected for the first six months.  After six months, the redness will fade and the scar will soften and fade.  The color difference becomes less noticeable with time.  If there are any problems, return for a post-op surgery check at your earliest convenience.  To improve the appearance of the scar, you can use silicone scar gel, cream, or sheets (such as Mederma or Serica) every night for up to one year. These are available over the counter (without a prescription).  Please call our office at [phone] for any questions or concerns.   Melanoma ABCDEs  Melanoma is the most dangerous type of skin cancer, and is the leading cause of death from skin disease.  You are more likely to develop melanoma if you: Have light-colored skin, light-colored eyes, or red or blond hair Spend a lot of time in the sun Tan regularly, either outdoors or in a tanning bed Have had blistering sunburns, especially during childhood Have a close family member who has had a melanoma Have atypical moles or large birthmarks  Early detection of melanoma is key since treatment is typically straightforward and cure rates are extremely high if we catch it early.   The first sign of melanoma is often a change in a mole or a new dark spot.  The ABCDE system is a way of remembering the signs of melanoma.  A for asymmetry:  The two halves do not match. B for border:  The edges of the growth are irregular. C for color:  A mixture of colors are present instead of an even brown color. D for diameter:  Melanomas are usually (but not always) greater than 13mm - the size of a pencil eraser. E for evolution:  The spot keeps changing in size,  shape, and color.  Please check your skin once per month between visits. You can use a small mirror in front and a large mirror behind you to keep an eye on the back side or your body.   If you see any new or changing lesions before your next follow-up, please call to schedule a visit.  Please continue daily skin protection including broad spectrum sunscreen SPF 30+ to sun-exposed areas, reapplying every 2 hours as needed when you're outdoors.   Staying in the shade or wearing long sleeves, sun glasses (UVA+UVB protection) and wide brim hats (4-inch brim around the entire circumference of the hat) are also recommended for sun protection.    Seborrheic Keratosis  What causes seborrheic keratoses? Seborrheic keratoses are harmless, common skin growths that first appear during adult life.  As time goes by, more growths appear.  Some people may develop a large number of them.  Seborrheic keratoses appear on both covered and uncovered body parts.  They are not caused by sunlight.  The tendency to develop seborrheic keratoses can be inherited.  They vary in color from skin-colored to gray, brown, or even black.  They can be either smooth or have a rough, warty surface.   Seborrheic keratoses are superficial and look as if they were stuck on the skin.  Under the microscope this type of keratosis looks like layers upon layers of skin.  That is why at times the top layer may seem to fall off, but the rest of the growth remains and re-grows.    Treatment Seborrheic keratoses do not need to be treated, but can easily be removed in the office.  Seborrheic keratoses often cause symptoms when they rub on clothing or jewelry.  Lesions can be in the way of shaving.  If they become inflamed, they can cause itching, soreness, or burning.  Removal of a seborrheic keratosis can be accomplished by freezing, burning, or surgery. If any spot bleeds, scabs, or grows rapidly, please return to have it checked, as these can  be an indication of a skin cancer.  Cryotherapy Aftercare  Wash gently with soap and water everyday.   Apply Vaseline and Band-Aid daily until healed.   If you have any questions or concerns for your doctor, please call our main line at [phone] and press option 4 to reach your doctor's medical assistant. If no one answers, please leave a voicemail as directed and we will return your call as soon as possible. Messages left after 4 pm will be answered the following business day.   You may also send [name] a message via [place]. We typically respond to MyChart messages within 1-2 business days.  For prescription refills, please ask your pharmacy to contact our office. Our fax number is [phone].  If  you have an urgent issue when the clinic is closed that cannot wait until the next business day, you can page your doctor at the number below.    Please note that while we do our best to be available for urgent issues outside of office hours, we are not available 24/7.   If you have an urgent issue and are unable to reach [name], you may choose to seek medical care at your doctor's office, retail clinic, urgent care center, or emergency room.  If you have a medical emergency, please immediately call 911 or go to the emergency department.  Pager Numbers  - Dr. Nehemiah Massed: [phone]  - Dr. Laurence Ferrari: [phone]  - Dr. Nicole Kindred: [phone]  In the event of inclement weather, please call our main line at [phone] for an update on the status of any delays or closures.  Dermatology Medication Tips: Please keep the boxes that topical medications come in in order to help keep track of the instructions about where and how to use these. Pharmacies typically print the medication instructions only on the boxes and not directly on the medication tubes.   If your medication is too expensive, please contact our office at [phone] option 4 or send [name] a message through [place].   We are unable to  tell what your co-pay for medications will be in advance as this is different depending on your insurance coverage. However, we may be able to find a substitute medication at lower cost or fill out paperwork to get insurance to cover a needed medication.   If a prior authorization is required to get your medication covered by your insurance company, please allow [name] 1-2 business days to complete this process.  Drug prices often vary depending on where the prescription is filled and some pharmacies may offer cheaper prices.  The website www.goodrx.com contains coupons for medications through different pharmacies. The prices here do not account for what the cost may be with help from insurance (it may be cheaper with your insurance), but the website can give you the price if you did not use any insurance.  - You can print the associated coupon and take it with your prescription to the pharmacy.  - You may also stop by our office during regular business hours and pick up a GoodRx coupon card.  - If you need your prescription sent electronically to a different pharmacy, notify our office through [facility] or by phone at [phone] option 4.

## 2021-05-13 ENCOUNTER — Telehealth: Payer: Self-pay

## 2021-05-13 NOTE — Telephone Encounter (Signed)
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

## 2021-05-13 NOTE — Telephone Encounter (Signed)
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 Surgery scheduled for 06/17/2021 at 1:30pm. Patient will come in office tomorrow 05/14/21 to have picture taken to document location.

## 2021-05-13 NOTE — Telephone Encounter (Signed)
Left pt message to please take a photo of her bx site for Dr. Vira Blanco

## 2021-06-17 ENCOUNTER — Other Ambulatory Visit: Payer: Self-pay

## 2021-06-17 ENCOUNTER — Encounter: Payer: Self-pay | Admitting: Dermatology

## 2021-06-17 ENCOUNTER — Ambulatory Visit (INDEPENDENT_AMBULATORY_CARE_PROVIDER_SITE_OTHER): Payer: Medicare Other | Admitting: Dermatology

## 2021-06-17 DIAGNOSIS — D0371 Melanoma in situ of right lower limb, including hip: Secondary | ICD-10-CM

## 2021-06-17 MED ORDER — MUPIROCIN 2 % EX OINT
1.0000 | TOPICAL_OINTMENT | Freq: Every day | CUTANEOUS | 1 refills | Status: DC
Start: 2021-06-17 — End: 2022-09-09

## 2021-06-17 NOTE — Patient Instructions (Addendum)
Wound Care Instructions  Cleanse wound gently with soap and water once a day then pat dry with clean gauze. Apply a thing coat of Petrolatum (petroleum jelly, "Vaseline") over the wound (unless you have an allergy to this). We recommend that you use a new, sterile tube of Vaseline. Do not pick or remove scabs. Do not remove the yellow or white "healing tissue" from the base of the wound.  Cover the wound with fresh, clean, nonstick gauze and secure with paper tape. You may use Band-Aids in place of gauze and tape if the would is small enough, but would recommend trimming much of the tape off as there is often too much. Sometimes Band-Aids can irritate the skin.  You should call the office for your biopsy report after 1 week if you have not already been contacted.  If you experience any problems, such as abnormal amounts of bleeding, swelling, significant bruising, significant pain, or evidence of infection, please call the office immediately.  FOR ADULT SURGERY PATIENTS: If you need something for pain relief you may take 1 extra strength Tylenol (acetaminophen) AND 2 Ibuprofen (200mg each) together every 4 hours as needed for pain. (do not take these if you are allergic to them or if you have a reason you should not take them.) Typically, you may only need pain medication for 1 to 3 days.   If you have any questions or concerns for your doctor, please call our main line at [phone] and press option 4 to reach your doctor's medical assistant. If no one answers, please leave a voicemail as directed and we will return your call as soon as possible. Messages left after 4 pm will be answered the following business day.   You may also send us a message via MyChart. We typically respond to MyChart messages within 1-2 business days.  For prescription refills, please ask your pharmacy to contact our office. Our fax number is [phone].  If you have an urgent issue when the clinic is closed that  cannot wait until the next business day, you can page your doctor at the number below.    Please note that while we do our best to be available for urgent issues outside of office hours, we are not available 24/7.   If you have an urgent issue and are unable to reach us, you may choose to seek medical care at your doctor's office, retail clinic, urgent care center, or emergency room.  If you have a medical emergency, please immediately call 911 or go to the emergency department.  Pager Numbers  - Dr. Kowalski: [phone]  - Dr. Moye: [phone]  - Dr. Stewart: [phone]  In the event of inclement weather, please call our main line at [phone] for an update on the status of any delays or closures.  Dermatology Medication Tips: Please keep the boxes that topical medications come in in order to help keep track of the instructions about where and how to use these. Pharmacies typically print the medication instructions only on the boxes and not directly on the medication tubes.   If your medication is too expensive, please contact our office at [phone] option 4 or send us a message through MyChart.   We are unable to tell what your co-pay for medications will be in advance as this is different depending on your insurance coverage. However, we may be able to find a substitute medication at lower cost or fill out paperwork to get insurance to cover a needed   medication.   If a prior authorization is required to get your medication covered by your insurance company, please allow [name] 1-2 business days to complete this process.  Drug prices often vary depending on where the prescription is filled and some pharmacies may offer cheaper prices.  The website www.goodrx.com contains coupons for medications through different pharmacies. The prices here do not account for what the cost may be with help from insurance (it may be cheaper with your insurance), but the website can give you the  price if you did not use any insurance.  - You can print the associated coupon and take it with your prescription to the pharmacy.  - You may also stop by our office during regular business hours and pick up a GoodRx coupon card.  - If you need your prescription sent electronically to a different pharmacy, notify our office through [facility] or by phone at [phone] option 4.   For the first dressing change in 24 hours, clean with soap and water then apply mupirocin ointment, cover with a non stick gauze then a few gauze folded over and  coban.  After this dressing change you can follow the wound care directions above.

## 2021-06-17 NOTE — Progress Notes (Signed)
   Follow-Up Visit   Subjective  Brittany Browning is a 80 y.o. 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 Patient presents for excision. ).   The following portions of the chart were reviewed this encounter and updated as appropriate:       Review of Systems:  No other skin or systemic complaints except as noted in HPI or Assessment and Plan.  Objective  Well appearing patient in no apparent distress; mood and affect are within normal limits.  A focused examination was performed including right medial calf. Relevant physical exam findings are noted in the Assessment and Plan.  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  Skin excision  Lesion length (cm):  0.7 Lesion width (cm):  0.7 Margin per side (cm):  0.5 Total excision diameter (cm):  1.7 Informed consent: discussed and consent obtained   Timeout: patient name, date of birth, surgical site, and procedure verified   Procedure prep:  Patient was prepped and draped in usual sterile fashion Prep type:  Povidone-iodine Anesthesia: the lesion was anesthetized in a standard fashion   Anesthetic:  1% lidocaine w/ epinephrine 1-100,000 buffered w/ 8.4% NaHCO3 (3cc lido, 10cc bupivicaine, total 13cc) Instrument used: #15 blade   Hemostasis achieved with: pressure and electrodesiccation   Outcome: patient tolerated procedure well with no complications   Additional details:  Tag at 12:00 superior tip  Skin repair Complexity:  Intermediate Final length (cm):  1.4 Informed consent: discussed and consent obtained   Timeout: patient name, date of birth, surgical site, and procedure verified   Reason for type of repair: reduce tension to allow closure, reduce the risk of dehiscence, infection, and necrosis, reduce subcutaneous dead space and avoid a hematoma, preserve normal anatomical and  functional relationships and enhance both functionality and cosmetic results   Undermining: edges undermined   Subcutaneous layers (deep stitches):  Suture size:  4-0 Suture type: Vicryl (polyglactin 910)   Subcutaneous suture technique: dermal purse string repair. Fine/surface layer approximation (top stitches):  Stitches comment:  None Suture removal (days):  7 Hemostasis achieved with: suture Outcome: patient tolerated procedure well with no complications   Post-procedure details: sterile dressing applied and wound care instructions given   Dressing type: pressure dressing (Mupirocin ointment)    mupirocin ointment (BACTROBAN) 2 % Apply 1 application topically daily. Qd to excision site  Specimen 1 - Surgical pathology Differential Diagnosis: Bx proven Melanoma IS Check Margins: yes Pink biopsy site 0.7cm [id number] Tag at 12:00 superior tip  Return in about 2 weeks (around 07/01/2021) for f/u Melanoma IS excision .  I, Othelia Pulling, RMA, am acting as scribe for Brendolyn Patty, MD .  Documentation: I have reviewed the above documentation for accuracy and completeness, and I agree with the above.  Brendolyn Patty MD

## 2021-06-18 ENCOUNTER — Telehealth: Payer: Self-pay

## 2021-06-18 NOTE — Telephone Encounter (Signed)
Patient doing fine after yesterdays surgery./sh 

## 2021-06-19 ENCOUNTER — Telehealth: Payer: Self-pay

## 2021-06-19 DIAGNOSIS — D039 Melanoma in situ, unspecified: Secondary | ICD-10-CM | POA: Insufficient documentation

## 2021-06-19 NOTE — Telephone Encounter (Signed)
-----   Message from Brendolyn Patty, MD sent at 06/18/2021  6:51 PM EST ----- Skin (M), right medial calf NO RESIDUAL MELANOMA IN SITU, MARGINS FREE  Pt has 2 week wound check - please call patient

## 2021-06-19 NOTE — Telephone Encounter (Signed)
Left message for patient to call for results. 

## 2021-06-20 ENCOUNTER — Telehealth: Payer: Self-pay

## 2021-06-20 NOTE — Telephone Encounter (Signed)
-----   Message from Brendolyn Patty, MD sent at 06/18/2021  6:51 PM EST ----- Skin (M), right medial calf NO RESIDUAL MELANOMA IN SITU, MARGINS FREE  Pt has 2 week wound check - please call patient

## 2021-06-20 NOTE — Telephone Encounter (Signed)
Patient advised of BX results.  °

## 2021-07-02 ENCOUNTER — Ambulatory Visit (INDEPENDENT_AMBULATORY_CARE_PROVIDER_SITE_OTHER): Payer: Medicare Other | Admitting: Dermatology

## 2021-07-02 ENCOUNTER — Other Ambulatory Visit: Payer: Self-pay

## 2021-07-02 DIAGNOSIS — D0371 Melanoma in situ of right lower limb, including hip: Secondary | ICD-10-CM

## 2021-07-02 DIAGNOSIS — L820 Inflamed seborrheic keratosis: Secondary | ICD-10-CM | POA: Diagnosis not present

## 2021-07-02 DIAGNOSIS — L738 Other specified follicular disorders: Secondary | ICD-10-CM | POA: Diagnosis not present

## 2021-07-02 NOTE — Patient Instructions (Addendum)
Recommend OTC [place] Bond Rapid Relief Anti-Itch cream (pramoxine + menthol), CeraVe Anti-itch cream or lotion (pramoxine), Sarna lotion (Original- menthol + camphor or Sensitive- pramoxine) or Eucerin 12 hour Itch Relief lotion (menthol) up to 3 times per day to areas on body that are itchy.  If You Need Anything After Your Visit  If you have any questions or concerns for your doctor, please call our main line at [phone] and press option 4 to reach your doctor's medical assistant. If no one answers, please leave a voicemail as directed and we will return your call as soon as possible. Messages left after 4 pm will be answered the following business day.   You may also send [name] a message via [place]. We typically respond to MyChart messages within 1-2 business days.  For prescription refills, please ask your pharmacy to contact our office. Our fax number is [phone].  If you have an urgent issue when the clinic is closed that cannot wait until the next business day, you can page your doctor at the number below.    Please note that while we do our best to be available for urgent issues outside of office hours, we are not available 24/7.   If you have an urgent issue and are unable to reach [name], you may choose to seek medical care at your doctor's office, retail clinic, urgent care center, or emergency room.  If you have a medical emergency, please immediately call 911 or go to the emergency department.  Pager Numbers  - Dr. Nehemiah Massed: [phone]  - Dr. Laurence Ferrari: [phone]  - Dr. Nicole Kindred: [phone]  In the event of inclement weather, please call our main line at [phone] for an update on the status of any delays or closures.  Dermatology Medication Tips: Please keep the boxes that topical medications come in in order to help keep track of the instructions about where and how to use these. Pharmacies typically print the medication instructions only on the boxes and not directly  on the medication tubes.   If your medication is too expensive, please contact our office at [phone] option 4 or send [name] a message through [place].   We are unable to tell what your co-pay for medications will be in advance as this is different depending on your insurance coverage. However, we may be able to find a substitute medication at lower cost or fill out paperwork to get insurance to cover a needed medication.   If a prior authorization is required to get your medication covered by your insurance company, please allow [name] 1-2 business days to complete this process.  Drug prices often vary depending on where the prescription is filled and some pharmacies may offer cheaper prices.  The website www.goodrx.com contains coupons for medications through different pharmacies. The prices here do not account for what the cost may be with help from insurance (it may be cheaper with your insurance), but the website can give you the price if you did not use any insurance.  - You can print the associated coupon and take it with your prescription to the pharmacy.  - You may also stop by our office during regular business hours and pick up a GoodRx coupon card.  - If you need your prescription sent electronically to a different pharmacy, notify our office through [facility] or by phone at [phone] option 4.     Si Usted Necesita Algo Despus de Su Visita  Tambin puede enviarnos un mensaje a Lawerance Cruel  de MyChart. Por lo general respondemos a los mensajes de MyChart en el transcurso de 1 a 2 das hbiles.  Para renovar recetas, por favor pida a su farmacia que se ponga en contacto con nuestra oficina. Harland Dingwall de fax es [place] [phone].  Si tiene un asunto urgente cuando la clnica est cerrada y que no puede esperar hasta el siguiente da hbil, puede llamar/localizar a su doctor(a) al nmero que aparece a continuacin.   Por favor, tenga en cuenta que aunque hacemos todo lo  posible para estar disponibles para asuntos urgentes fuera del horario de [place], no estamos disponibles las 24 horas del da, los 7 das de la [place].   Si tiene un problema urgente y no puede comunicarse con nosotros, puede optar por buscar atencin mdica  en el consultorio de su doctor(a), en una clnica privada, en un centro de atencin urgente o en una sala de emergencias.  Si tiene Engineering geologist, por favor llame inmediatamente al 911 o vaya a la sala de emergencias.  Nmeros de bper  - Dr. Nehemiah Massed: [phone]  - Dra. Moye: [phone]  - Dra. Nicole Kindred: [phone]  En caso de inclemencias del [place], por favor llame a Johnsie Kindred principal al [phone] para una actualizacin sobre el [address] de cualquier retraso o cierre.  Consejos para la medicacin en dermatologa: Por favor, guarde las cajas en las que vienen los medicamentos de uso tpico para ayudarle a seguir las instrucciones sobre dnde y cmo usarlos. Las farmacias generalmente imprimen las instrucciones del medicamento slo en las cajas y no directamente en los tubos del [place].   Si su medicamento es muy caro, por favor, pngase en contacto con Zigmund Daniel llamando al [phone] y presione la opcin 4 o envenos un mensaje a travs de Pharmacist, community.   No podemos decirle cul ser su copago por los medicamentos por adelantado ya que esto es diferente dependiendo de la cobertura de su seguro. Sin embargo, es posible que podamos encontrar un medicamento sustituto a Electrical engineer un formulario para que el seguro cubra el medicamento que se considera necesario.   Si se requiere una autorizacin previa para que su compaa de seguros [place] su medicamento, por favor permtanos de 1 a 2 das hbiles para completar este proceso.  Los precios de los medicamentos varan con frecuencia dependiendo del Environmental consultant de dnde se surte la receta y alguna farmacias pueden ofrecer precios ms baratos.  El sitio web  www.goodrx.com tiene cupones para medicamentos de Airline pilot. Los precios aqu no tienen en cuenta lo que podra costar con la ayuda del seguro (puede ser ms barato con su seguro), pero el sitio web puede darle el precio si no utiliz Research scientist (physical sciences).  - Puede imprimir el cupn correspondiente y llevarlo con su receta a la farmacia.  - Tambin puede pasar por nuestra oficina durante el horario de atencin regular y Charity fundraiser una tarjeta de cupones de GoodRx.  - Si necesita que su receta se enve electrnicamente a una farmacia diferente, informe a nuestra oficina a travs de MyChart de [place] o por telfono llamando al [phone] y presione la opcin 4.  Bandaid Hydro Seal (blister bandaids) can be left on for up to 3 days, no need to use ointment  Waterproof Tegaderm

## 2021-07-02 NOTE — Progress Notes (Signed)
   Follow-Up Visit   Subjective  Brittany Browning is a 80 y.o. female who presents for the following: Follow-up (Patient here today to recheck surgery site at right medial calf. She would also like spot at right upper eyelid checked. She is using CeraVe SA lotion but advises pus continues to come out of the bump. Patient did have an ISK treated with LN2 at right medial eyebrow in October. ). It is still red and flaky.   The following portions of the chart were reviewed this encounter and updated as appropriate:       Review of Systems:  No other skin or systemic complaints except as noted in HPI or Assessment and Plan.  Objective  Well appearing patient in no apparent distress; mood and affect are within normal limits.  A focused examination was performed including legs, feet, face. Relevant physical exam findings are noted in the Assessment and Plan.  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  Continue Mupirocin ointment qd and cover  Related Medications mupirocin ointment (BACTROBAN) 2 % Apply 1 application topically daily. Qd to excision site  Inflamed seborrheic keratosis right medial eyebrow  Sample of Eucrisa given to patient to use 1-2 times daily.  Lot #[id number]  Exp: 07/2023  Recheck on f/up, consider repeat LN2 if not improved  Sebaceous hyperplasia of face Right Upper Eyelid  Irritated. Patient defers biopsy today to r/o BCC. Will photo and recheck on f/up.  Avoid picking  Return for as scheduled, TBSE.  Graciella Belton, RMA, am acting as scribe for Brendolyn Patty, MD .  Documentation: I have reviewed the above  documentation for accuracy and completeness, and I agree with the above.  Brendolyn Patty MD

## 2021-07-18 DIAGNOSIS — R682 Dry mouth, unspecified: Secondary | ICD-10-CM | POA: Insufficient documentation

## 2021-08-13 ENCOUNTER — Other Ambulatory Visit: Payer: Self-pay

## 2021-08-13 ENCOUNTER — Ambulatory Visit (INDEPENDENT_AMBULATORY_CARE_PROVIDER_SITE_OTHER): Payer: Medicare Other | Admitting: Dermatology

## 2021-08-13 DIAGNOSIS — I781 Nevus, non-neoplastic: Secondary | ICD-10-CM

## 2021-08-13 DIAGNOSIS — Z85828 Personal history of other malignant neoplasm of skin: Secondary | ICD-10-CM

## 2021-08-13 DIAGNOSIS — Z86006 Personal history of melanoma in-situ: Secondary | ICD-10-CM

## 2021-08-13 DIAGNOSIS — D2271 Melanocytic nevi of right lower limb, including hip: Secondary | ICD-10-CM | POA: Diagnosis not present

## 2021-08-13 DIAGNOSIS — L578 Other skin changes due to chronic exposure to nonionizing radiation: Secondary | ICD-10-CM

## 2021-08-13 DIAGNOSIS — L814 Other melanin hyperpigmentation: Secondary | ICD-10-CM

## 2021-08-13 DIAGNOSIS — Z1283 Encounter for screening for malignant neoplasm of skin: Secondary | ICD-10-CM

## 2021-08-13 DIAGNOSIS — D229 Melanocytic nevi, unspecified: Secondary | ICD-10-CM

## 2021-08-13 DIAGNOSIS — L719 Rosacea, unspecified: Secondary | ICD-10-CM

## 2021-08-13 DIAGNOSIS — L738 Other specified follicular disorders: Secondary | ICD-10-CM

## 2021-08-13 DIAGNOSIS — D1801 Hemangioma of skin and subcutaneous tissue: Secondary | ICD-10-CM

## 2021-08-13 DIAGNOSIS — L821 Other seborrheic keratosis: Secondary | ICD-10-CM

## 2021-08-13 DIAGNOSIS — D2362 Other benign neoplasm of skin of left upper limb, including shoulder: Secondary | ICD-10-CM

## 2021-08-13 NOTE — Progress Notes (Signed)
Follow-Up Visit   Subjective  Brittany Browning is a 81 y.o. female who presents for the following: Follow-up (Patient here today for 3 month follow up. Patient reports a spot right anterior leg. ). She has h/o melanoma IS  The patient presents for Total-Body Skin Exam (TBSE) for skin cancer screening and mole check.  The patient has spots, moles and lesions to be evaluated, some may be new or changing and the patient has concerns that these could be cancer.   The following portions of the chart were reviewed this encounter and updated as appropriate:      Review of Systems: No other skin or systemic complaints except as noted in HPI or Assessment and Plan.   Objective  Well appearing patient in no apparent distress; mood and affect are within normal limits.  A full examination was performed including scalp, head, eyes, ears, nose, lips, neck, chest, axillae, abdomen, back, buttocks, bilateral upper extremities, bilateral lower extremities, hands, feet, fingers, toes, fingernails, and toenails. All findings within normal limits unless otherwise noted below.  right anterior lower thigh 1 mm medium pigmented dark brown macule   right posterior hip 5 mm speckled tan macule   right lateral thigh 3 mm medium dark brown macule                right nasal tip, R NL fold Pink scaly macule R nasal tip, resolving inflammatory papule R NL fold    Assessment & Plan  Nevus (3) right anterior lower thigh; right lateral thigh; right posterior hip  Benign-appearing.  Observation.  Call clinic for new or changing lesions.  Recommend daily use of broad spectrum spf 30+ sunscreen to sun-exposed areas.   ABCDEs of mole observation discussed.  RTC if any changes noted.      Rosacea right nasal tip, R NL fold  Rosacea is a chronic progressive skin condition usually affecting the face of adults, causing redness and/or acne bumps. It is treatable but not curable. It sometimes affects  the eyes (ocular rosacea) as well. It may respond to topical and/or systemic medication and can flare with stress, sun exposure, alcohol, exercise and some foods.  Daily application of broad spectrum spf 30+ sunscreen to face is recommended to reduce flares.  Start soolantra apply nightly to affected area of redness   Start finacea  foam apply in morning daily to affected areas of face for redness   Samples given, pt will call for Rx if needed Recheck R nasal tip on f/up    Samples given of each in office    Lentigines - Scattered tan macules - Due to sun exposure - Benign-appearing, observe - Recommend daily broad spectrum sunscreen SPF 30+ to sun-exposed areas, reapply every 2 hours as needed. - Call for any changes  Seborrheic Keratoses - Stuck-on, waxy, tan-brown papules and/or plaques  - Benign-appearing - Discussed benign etiology and prognosis. - Observe - Call for any changes  Melanocytic Nevi - Tan-brown and/or pink-flesh-colored symmetric macules and papules - Benign appearing on exam today - Observation - Call clinic for new or changing moles - Recommend daily use of broad spectrum spf 30+ sunscreen to sun-exposed areas.   Hemangiomas - Red papules - Discussed benign nature - Observe - Call for any changes  Dermatofibroma - Firm pink/brown papulenodule with dimple sign at left posterior upper arm  and left pretibia  - Benign appearing - Call for any changes  Sebaceous Hyperplasia - Small yellow papules with a central dell  at face - Benign - Observe  Varicose Veins/Spider Veins - Dilated blue, purple or red veins at the lower extremities - Reassured - Smaller vessels can be treated by sclerotherapy (a procedure to inject a medicine into the veins to make them disappear) if desired, but the treatment is not covered by insurance. Larger vessels may be covered if symptomatic and we would refer to vascular surgeon if treatment desired.  Actinic Damage -  Chronic condition, secondary to cumulative UV/sun exposure - diffuse scaly erythematous macules with underlying dyspigmentation - Recommend daily broad spectrum sunscreen SPF 30+ to sun-exposed areas, reapply every 2 hours as needed.  - Staying in the shade or wearing long sleeves, sun glasses (UVA+UVB protection) and wide brim hats (4-inch brim around the entire circumference of the hat) are also recommended for sun protection.  - Call for new or changing lesions.  History of Melanoma in Situ - No evidence of recurrence today at right medial calf excised 06/2021, violaceous scar  - Recommend regular full body skin exams - Recommend daily broad spectrum sunscreen SPF 30+ to sun-exposed areas, reapply every 2 hours as needed.  - Call if any new or changing lesions are noted between office visits  History of Basal Cell Carcinoma of the Skin - No evidence of recurrence today right upper back  - Recommend regular full body skin exams - Recommend daily broad spectrum sunscreen SPF 30+ to sun-exposed areas, reapply every 2 hours as needed.  - Call if any new or changing lesions are noted between office visits   Skin cancer screening performed today. Return for 6 month tbse h/o of melanoma in situ . I, Ruthell Rummage, CMA, am acting as scribe for Brendolyn Patty, MD.  Documentation: I have reviewed the above documentation for accuracy and completeness, and I agree with the above.  Brendolyn Patty MD

## 2021-08-13 NOTE — Patient Instructions (Addendum)
Recommend applying Cerave moisturizer after swimming   Give [name] a call if you like one of the samples we give you for redness at face and we can send in prescription for either sample   Recommend Niacinamide or Nicotinamide 500mg  twice per day to lower risk of non-melanoma skin cancer by approximately 25%. This is usually available at [employer].   Recommend taking Heliocare sun protection supplement daily in sunny weather for additional sun protection. For maximum protection on the sunniest days, you can take up to 2 capsules of regular Heliocare OR take 1 capsule of Heliocare Ultra. For prolonged exposure (such as a full day in the sun), you can repeat your dose of the supplement 4 hours after your first dose. Heliocare can be purchased at [facility] or at [email or web address].    Melanoma ABCDEs  Melanoma is the most dangerous type of skin cancer, and is the leading cause of death from skin disease.  You are more likely to develop melanoma if you: Have light-colored skin, light-colored eyes, or red or blond hair Spend a lot of time in the sun Tan regularly, either outdoors or in a tanning bed Have had blistering sunburns, especially during childhood Have a close family member who has had a melanoma Have atypical moles or large birthmarks  Early detection of melanoma is key since treatment is typically straightforward and cure rates are extremely high if we catch it early.   The first sign of melanoma is often a change in a mole or a new dark spot.  The ABCDE system is a way of remembering the signs of melanoma.  A for asymmetry:  The two halves do not match. B for border:  The edges of the growth are irregular. C for color:  A mixture of colors are present instead of an even brown color. D for diameter:  Melanomas are usually (but not always) greater than 1mm - the size of a pencil eraser. E for evolution:  The spot keeps changing in size, shape, and color.  Please check  your skin once per month between visits. You can use a small mirror in front and a large mirror behind you to keep an eye on the back side or your body.   If you see any new or changing lesions before your next follow-up, please call to schedule a visit.  Please continue daily skin protection including broad spectrum sunscreen SPF 30+ to sun-exposed areas, reapplying every 2 hours as needed when you're outdoors.   Staying in the shade or wearing long sleeves, sun glasses (UVA+UVB protection) and wide brim hats (4-inch brim around the entire circumference of the hat) are also recommended for sun protection.    If You Need Anything After Your Visit  If you have any questions or concerns for your doctor, please call our main line at [phone] and press option 4 to reach your doctor's medical assistant. If no one answers, please leave a voicemail as directed and we will return your call as soon as possible. Messages left after 4 pm will be answered the following business day.   You may also send [name] a message via [place]. We typically respond to MyChart messages within 1-2 business days.  For prescription refills, please ask your pharmacy to contact our office. Our fax number is [phone].  If you have an urgent issue when the clinic is closed that cannot wait until the next business day, you can page your doctor at the number below.  Please note that while we do our best to be available for urgent issues outside of office hours, we are not available 24/7.   If you have an urgent issue and are unable to reach [name], you may choose to seek medical care at your doctor's office, retail clinic, urgent care center, or emergency room.  If you have a medical emergency, please immediately call 911 or go to the emergency department.  Pager Numbers  - Dr. Nehemiah Massed: [phone]  - Dr. Laurence Ferrari: [phone]  - Dr. Nicole Kindred: [phone]  In the event of inclement weather, please call our main line  at [phone] for an update on the status of any delays or closures.  Dermatology Medication Tips: Please keep the boxes that topical medications come in in order to help keep track of the instructions about where and how to use these. Pharmacies typically print the medication instructions only on the boxes and not directly on the medication tubes.   If your medication is too expensive, please contact our office at [phone] option 4 or send [name] a message through [place].   We are unable to tell what your co-pay for medications will be in advance as this is different depending on your insurance coverage. However, we may be able to find a substitute medication at lower cost or fill out paperwork to get insurance to cover a needed medication.   If a prior authorization is required to get your medication covered by your insurance company, please allow [name] 1-2 business days to complete this process.  Drug prices often vary depending on where the prescription is filled and some pharmacies may offer cheaper prices.  The website www.goodrx.com contains coupons for medications through different pharmacies. The prices here do not account for what the cost may be with help from insurance (it may be cheaper with your insurance), but the website can give you the price if you did not use any insurance.  - You can print the associated coupon and take it with your prescription to the pharmacy.  - You may also stop by our office during regular business hours and pick up a GoodRx coupon card.  - If you need your prescription sent electronically to a different pharmacy, notify our office through [facility] or by phone at [phone] option 4.     Si Usted Necesita Algo Despus de Su Visita  Tambin puede enviarnos un mensaje a travs de Pharmacist, community. Por lo general respondemos a los mensajes de MyChart en el transcurso de 1 a 2 das hbiles.  Para renovar recetas, por favor pida a su farmacia que se  ponga en contacto con nuestra oficina. Harland Dingwall de fax es [place] [phone].  Si tiene un asunto urgente cuando la clnica est cerrada y que no puede esperar hasta el siguiente da hbil, puede llamar/localizar a su doctor(a) al nmero que aparece a continuacin.   Por favor, tenga en cuenta que aunque hacemos todo lo posible para estar disponibles para asuntos urgentes fuera del horario de [place], no estamos disponibles las 24 horas del da, los 7 das de la [place].   Si tiene un problema urgente y no puede comunicarse con nosotros, puede optar por buscar atencin mdica  en el consultorio de su doctor(a), en una clnica privada, en un centro de atencin urgente o en una sala de emergencias.  Si tiene Engineering geologist, por favor llame inmediatamente al 911 o vaya a la sala de emergencias.  Nmeros de bper  - Dr. Nehemiah Massed:  [phone]  - Dra. Moye: [phone]  - Dra. Nicole Kindred: [phone]  En caso de inclemencias del [place], por favor llame a Johnsie Kindred principal al [phone] para una actualizacin sobre el [place] de cualquier retraso o cierre.  Consejos para la medicacin en dermatologa: Por favor, guarde las cajas en las que vienen los medicamentos de uso tpico para ayudarle a seguir las instrucciones sobre dnde y cmo usarlos. Las farmacias generalmente imprimen las instrucciones del medicamento slo en las cajas y no directamente en los tubos del [place].   Si su medicamento es muy caro, por favor, pngase en contacto con Zigmund Daniel llamando al [phone] y presione la opcin 4 o envenos un mensaje a travs de Pharmacist, community.   No podemos decirle cul ser su copago por los medicamentos por adelantado ya que esto es diferente dependiendo de la cobertura de su seguro. Sin embargo, es posible que podamos encontrar un medicamento sustituto a Electrical engineer un formulario para que el seguro cubra el medicamento que se considera necesario.   Si se requiere  una autorizacin previa para que su compaa de seguros [place] su medicamento, por favor permtanos de 1 a 2 das hbiles para completar este proceso.  Los precios de los medicamentos varan con frecuencia dependiendo del Environmental consultant de dnde se surte la receta y alguna farmacias pueden ofrecer precios ms baratos.  El sitio web www.goodrx.com tiene cupones para medicamentos de Airline pilot. Los precios aqu no tienen en cuenta lo que podra costar con la ayuda del seguro (puede ser ms barato con su seguro), pero el sitio web puede darle el precio si no utiliz Research scientist (physical sciences).  - Puede imprimir el cupn correspondiente y llevarlo con su receta a la farmacia.  - Tambin puede pasar por nuestra oficina durante el horario de atencin regular y Charity fundraiser una tarjeta de cupones de GoodRx.  - Si necesita que su receta se enve electrnicamente a una farmacia diferente, informe a nuestra oficina a travs de MyChart de [place] o por telfono llamando al [phone] y presione la opcin 4.

## 2021-08-19 NOTE — Progress Notes (Signed)
error 

## 2021-09-02 ENCOUNTER — Encounter: Payer: Self-pay | Admitting: Emergency Medicine

## 2021-09-02 ENCOUNTER — Other Ambulatory Visit: Payer: Self-pay

## 2021-09-02 ENCOUNTER — Ambulatory Visit
Admission: EM | Admit: 2021-09-02 | Discharge: 2021-09-02 | Disposition: A | Discharge status: Home / Self Care | Payer: Medicare Other | Attending: Physician Assistant | Admitting: Physician Assistant

## 2021-09-02 DIAGNOSIS — H00014 Hordeolum externum left upper eyelid: Secondary | ICD-10-CM | POA: Diagnosis not present

## 2021-09-02 MED ORDER — TOBRAMYCIN 0.3 % OP OINT
TOPICAL_OINTMENT | Freq: Four times a day (QID) | OPHTHALMIC | 0 refills | Status: DC
Start: 2021-09-02 — End: 2021-09-02

## 2021-09-02 MED ORDER — TOBRAMYCIN 0.3 % OP SOLN: 1.0000 [drp] | OPHTHALMIC | 0 refills | Status: AC

## 2021-09-02 NOTE — ED Provider Notes (Signed)
[name]    CSN: [id number] Arrival date & time: 09/02/21  1000      History   Chief Complaint Chief Complaint  Patient presents with   Eye Problem    HPI Brittany Browning is a 81 y.o. female.   The history is provided by the patient. No language interpreter was used.  Eye Problem Location:  Left eye Quality:  Aching Severity:  Moderate Onset quality:  Gradual Duration:  2 days Timing:  Constant Progression:  Worsening Chronicity:  New Relieved by:  Flushing Worsened by:  Nothing Ineffective treatments:  None tried Associated symptoms: inflammation, itching and swelling   Risk factors: no conjunctival hemorrhage    Past Medical History:  Diagnosis Date   Anemia    Anxiety    Arthritis    osteoarthritis   Basal cell carcinoma (BCC)    Cataract    Complication of anesthesia    has had problems in the past. 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   PAC (premature atrial contraction) 07/04/2019   Closed Colles' fracture 03/14/2019   Closed fracture of fifth metatarsal bone 03/14/2019   Osteopenia of multiple sites 09/24/2018   Hypoxemia 06/21/2018   Venous reflux 08/05/2017   Obstructive sleep apnea 07/17/2017   B12 deficiency 07/16/2017   Polypharmacy 07/16/2017   Depression 07/15/2017   Allergic rhinitis 06/24/2017   Anxiety 06/24/2017   Osteoarthritis 06/23/2017   Atypical chest pain 06/23/2017   Chronic back pain 06/23/2017   Female stress incontinence 06/23/2017   Hypercholesterolemia 06/23/2017    Osteoporosis 06/23/2017   Situational stress 06/23/2017   Vitamin D deficiency 06/23/2017   Gait instability 06/23/2017   RLS (restless legs syndrome) 12/31/2016   Insomnia 12/31/2016   Urinary incontinence 12/31/2016   Arthralgia 12/31/2016   DNAR (do not attempt resuscitation) 05/30/2014   Cataract 06/18/2012   Dizziness 03/02/2012   Referred otalgia 03/02/2012   Allergic rhinitis due to pollen 10/01/2007   Carpal tunnel syndrome 10/01/2007   Disorder of bone and cartilage 10/01/2007    Past Surgical History:  Procedure Laterality Date   CATARACT EXTRACTION W/PHACO Left 10/20/2017   Procedure: CATARACT EXTRACTION PHACO AND INTRAOCULAR LENS PLACEMENT ([place]);  Surgeon: Birder Robson, MD;  Location: [facility] ORS;  Service: Ophthalmology;  Laterality: Left;  [name] 00:39.7 AP% 12.9 CDE 5.09 Fluid Pack Lot # [id number]   CATARACT EXTRACTION W/PHACO Right 02/19/2021   Procedure: CATARACT EXTRACTION PHACO AND INTRAOCULAR LENS PLACEMENT ([place]) RIGHT;  Surgeon: Birder Robson, MD;  Location: [facility];  Service: Ophthalmology;  Laterality: Right;  6.15 0:38.6   COLONOSCOPY     SKIN CANCER EXCISION     TONSILLECTOMY AND ADENOIDECTOMY  1944    OB History   No obstetric history on file.      Home Medications    Prior to Admission medications   Medication Sig Start Date End Date Taking? Authorizing Provider  tobramycin (TOBREX) 0.3 % ophthalmic solution Place 1 drop into the right eye every 4 (four) hours for 10 days.  09/02/21 09/12/21 Yes Fransico Meadow, PA-C  aspirin 81 MG EC tablet Take 81 mg by mouth daily.    [provider]  Azelastine HCl 0.15 % SOLN Place 096.2 application into the nose. Place 2 sprays into both nostrils nightly 02/06/16   [provider]  Bioflavonoid Products (ESTER-C) TABS Take 1 tablet by mouth daily.    [provider]  CALCIUM PO Take 1,400 mg by mouth daily.    [provider]  Cinnamon 500 MG capsule Take 2  capsules by mouth daily.     [provider]  Estradiol 10 MCG TABS vaginal tablet Place 10 mcg vaginally.    [provider]  fluocinonide ointment (LIDEX) 0.05 % fluocinonide 0.05 % topical ointment 09/29/17   [provider]  folic acid (FOLVITE) 1 MG tablet Take 800 mcg by mouth daily. 10/18/07   [provider]  Glucosamine-Chondroitin (COSAMIN DS PO) Take 1 tablet by mouth 2 (two) times daily.     [provider]  Hypertonic Nasal Wash (SINUS RINSE NA) Place 1 application into the nose daily.    [provider]  ketotifen (ZADITOR) 0.025 % ophthalmic solution Apply 1 drop to eye 2 (two) times daily as needed.    [provider]  levocetirizine (XYZAL) 5 MG tablet Take 1 tablet by mouth daily with breakfast.  02/06/16 02/19/21  [provider]  MAGNESIUM GLUCONATE PO Take 140 mg by mouth daily.    [provider]  meloxicam (MOBIC) 15 MG tablet Take 15 mg by mouth daily as needed. 04/19/21   [provider]  montelukast (SINGULAIR) 10 MG tablet Take 1 tablet by mouth daily. 10/14/10   [provider]  Multiple Vitamins-Minerals (PRESERVISION AREDS PO) Take by mouth daily.    [provider]  mupirocin ointment (BACTROBAN) 2 % Apply 1 application topically daily. Qd to excision site 06/17/21   Brendolyn Patty, MD  NONFORMULARY OR COMPOUNDED ITEM Cream with cannabis oil    [provider]  ondansetron (ZOFRAN-ODT) 4 MG disintegrating tablet Take 4 mg by mouth as needed for nausea or vomiting.    [provider]  OXYGEN Inhale 2 L into the lungs at bedtime.    [provider]  pramipexole (MIRAPEX) 0.125 MG tablet Take 0.125 mg by mouth daily.    [provider]  Probiotic Product (PROBIOTIC PO) Take by mouth daily.    [provider]  Rotigotine 1 MG/24HR PT24 Place onto the skin.    [provider]  sennosides-docusate sodium (SENOKOT-S)  8.6-50 MG tablet Take 1 tablet by mouth daily.    [provider]  simethicone (MYLICON) 836 MG chewable tablet Chew 125 mg by mouth daily.    [provider]  vitamin B-12 (CYANOCOBALAMIN) 1000 MCG tablet Take 1,000 mcg by mouth daily.    [provider]    Family History Family History  Problem Relation Age of Onset   Varicose Veins Mother    Stroke Maternal Grandfather    Breast cancer Neg Hx     Social History Social History   Tobacco Use   Smoking status: Never   Smokeless tobacco: Never  Vaping Use   Vaping Use: Never used  Substance Use Topics   Alcohol use: No   Drug use: No     Allergies   Lactose, Bupropion, Lactose intolerance (gi), Sulfa antibiotics, and Zolpidem   Review of Systems Review of Systems  Eyes:  Positive for itching.  All  other systems reviewed and are negative.   Physical Exam Triage Vital Signs ED Triage Vitals  Enc Vitals Group     BP 09/02/21 1040 120/70     Pulse Rate 09/02/21 1040 86     Resp 09/02/21 1040 18     Temp 09/02/21 1040 98.2 F (36.8 C)     Temp Source 09/02/21 1040 Oral     SpO2 09/02/21 1040 93 %     Weight --      Height --      Head Circumference --      Peak Flow --      Pain Score 09/02/21 1044 0     Pain Loc --      Pain Edu? --      Excl. in [place]? --    No data found.  Updated Vital Signs BP 120/70 (BP Location: Left Arm)    Pulse 86    Temp 98.2 F (36.8 C) (Oral)    Resp 18    SpO2 93%   Visual Acuity Right Eye Distance:   Left Eye Distance:   Bilateral Distance:    Right Eye Near:   Left Eye Near:    Bilateral Near:     Physical Exam Vitals reviewed.  Constitutional:      Appearance: Normal appearance.  HENT:     Head: Normocephalic.     Mouth/Throat:     Mouth: Mucous membranes are moist.  Eyes:     Extraocular Movements: Extraocular movements intact.     Pupils: Pupils are equal, round, and reactive to light.     Comments: Swelling left eyelid,  red,     Cardiovascular:     Rate and Rhythm: Normal rate.  Pulmonary:     Effort: Pulmonary effort is normal.  Musculoskeletal:        General: Normal range of motion.  Skin:    General: Skin is warm.  Neurological:     General: No focal deficit present.     Mental Status: She is alert.  Psychiatric:        Mood and Affect: Mood normal.     UC Treatments / Results  Labs (all labs ordered are listed, but only abnormal results are displayed) Labs Reviewed - No data to display  EKG   Radiology No results found.  Procedures Procedures (including critical care time)  Medications Ordered in UC Medications - No data to display  Initial Impression / Assessment and Plan / UC Course  I have reviewed the triage vital signs and the nursing notes.  Pertinent labs & imaging results that were available during my care of the patient were reviewed by me and considered in my medical decision making (see chart for details).     MDM: Pt appears to be developing a stye or infected eyelid.  Pt worried about a spider bite,  no sign of bite.  Pt given rx for tobrex opth sol  Final Clinical Impressions(s) / UC Diagnoses   Final diagnoses:  Hordeolum externum of left upper eyelid     Discharge Instructions      Warm compresses 20 minutes 4 times a day   ED Prescriptions     Medication Sig Dispense Auth. Provider   tobramycin (TOBREX) 0.3 % ophthalmic ointment  (Status: Discontinued) Place into both eyes 4 (four) times daily for 10 days. Place a 1/2 inch ribbon of ointment into the lower eyelid. 3.5 g Larkin Morelos K, PA-C   tobramycin (TOBREX) 0.3 %  ophthalmic solution Place 1 drop into the right eye every 4 (four) hours for 10 days. 5 mL Fransico Meadow, [place]      PDMP not reviewed this encounter. An After Visit Summary was printed and given to the patient.    Fransico Meadow, [place] 09/02/21 1205

## 2021-09-02 NOTE — ED Triage Notes (Signed)
Pt here with left upper eyelid redness and pruritis x 2 days. Has tried hot compresses and neosporin without relief. No vision changes, sclera is normal color.

## 2021-09-02 NOTE — Discharge Instructions (Signed)
Warm compresses 20 minutes 4 times a day  

## 2022-01-14 DIAGNOSIS — R7303 Prediabetes: Secondary | ICD-10-CM | POA: Insufficient documentation

## 2022-02-25 ENCOUNTER — Ambulatory Visit: Payer: Medicare Other | Admitting: Dermatology

## 2022-03-05 ENCOUNTER — Ambulatory Visit (INDEPENDENT_AMBULATORY_CARE_PROVIDER_SITE_OTHER): Payer: Medicare Other | Admitting: Dermatology

## 2022-03-05 ENCOUNTER — Encounter: Payer: Self-pay | Admitting: Dermatology

## 2022-03-05 DIAGNOSIS — L578 Other skin changes due to chronic exposure to nonionizing radiation: Secondary | ICD-10-CM

## 2022-03-05 DIAGNOSIS — Z1283 Encounter for screening for malignant neoplasm of skin: Secondary | ICD-10-CM

## 2022-03-05 DIAGNOSIS — D2271 Melanocytic nevi of right lower limb, including hip: Secondary | ICD-10-CM

## 2022-03-05 DIAGNOSIS — L84 Corns and callosities: Secondary | ICD-10-CM

## 2022-03-05 DIAGNOSIS — D1800 Hemangioma unspecified site: Secondary | ICD-10-CM

## 2022-03-05 DIAGNOSIS — Z86006 Personal history of melanoma in-situ: Secondary | ICD-10-CM

## 2022-03-05 DIAGNOSIS — Z85828 Personal history of other malignant neoplasm of skin: Secondary | ICD-10-CM

## 2022-03-05 DIAGNOSIS — D229 Melanocytic nevi, unspecified: Secondary | ICD-10-CM

## 2022-03-05 DIAGNOSIS — L821 Other seborrheic keratosis: Secondary | ICD-10-CM

## 2022-03-05 DIAGNOSIS — L814 Other melanin hyperpigmentation: Secondary | ICD-10-CM

## 2022-03-05 DIAGNOSIS — D2272 Melanocytic nevi of left lower limb, including hip: Secondary | ICD-10-CM

## 2022-03-05 DIAGNOSIS — L905 Scar conditions and fibrosis of skin: Secondary | ICD-10-CM

## 2022-03-05 DIAGNOSIS — I8393 Asymptomatic varicose veins of bilateral lower extremities: Secondary | ICD-10-CM

## 2022-03-05 NOTE — Patient Instructions (Addendum)
Can take Niacinamide or Nicotinamide '500mg'$  twice per day to lower risk of non-melanoma skin cancer by approximately 25%. This is usually available at [employer].    Scar on leg: Recommend using Serica Scar Gel as directed on label.     Recommend daily broad spectrum sunscreen SPF 30+ to sun-exposed areas, reapply every 2 hours as needed. Call for new or changing lesions.  Staying in the shade or wearing long sleeves, sun glasses (UVA+UVB protection) and wide brim hats (4-inch brim around the entire circumference of the hat) are also recommended for sun protection.    Melanoma ABCDEs  Melanoma is the most dangerous type of skin cancer, and is the leading cause of death from skin disease.  You are more likely to develop melanoma if you: Have light-colored skin, light-colored eyes, or red or blond hair Spend a lot of time in the sun Tan regularly, either outdoors or in a tanning bed Have had blistering sunburns, especially during childhood Have a close family member who has had a melanoma Have atypical moles or large birthmarks  Early detection of melanoma is key since treatment is typically straightforward and cure rates are extremely high if we catch it early.   The first sign of melanoma is often a change in a mole or a new dark spot.  The ABCDE system is a way of remembering the signs of melanoma.  A for asymmetry:  The two halves do not match. B for border:  The edges of the growth are irregular. C for color:  A mixture of colors are present instead of an even brown color. D for diameter:  Melanomas are usually (but not always) greater than 53m - the size of a pencil eraser. E for evolution:  The spot keeps changing in size, shape, and color.  Please check your skin once per month between visits. You can use a small mirror in front and a large mirror behind you to keep an eye on the back side or your body.   If you see any new or changing lesions before your next follow-up,  please call to schedule a visit.  Please continue daily skin protection including broad spectrum sunscreen SPF 30+ to sun-exposed areas, reapplying every 2 hours as needed when you're outdoors.   Staying in the shade or wearing long sleeves, sun glasses (UVA+UVB protection) and wide brim hats (4-inch brim around the entire circumference of the hat) are also recommended for sun protection.     Due to recent changes in healthcare laws, you may see results of your pathology and/or laboratory studies on MyChart before the doctors have had a chance to review them. We understand that in some cases there may be results that are confusing or concerning to you. Please understand that not all results are received at the same time and often the doctors may need to interpret multiple results in order to provide you with the best plan of care or course of treatment. Therefore, we ask that you please give u[place]2 business days to thoroughly review all your results before contacting the office for clarification. Should we see a critical lab result, you will be contacted sooner.   If You Need Anything After Your Visit  If you have any questions or concerns for your doctor, please call our main line at [phone] press option 4 to reach your doctor's medical assistant. If no one answers, please leave a voicemail as directed and we will return your call as soon  as possible. Messages left after 4 pm will be answered the following business day.   You may also send [name] a message via [place]. We typically respond to MyChart messages within 1-2 business days.  For prescription refills, please ask your pharmacy to contact our office. Our fax number is [phone].  If you have an urgent issue when the clinic is closed that cannot wait until the next business day, you can page your doctor at the number below.    Please note that while we do our best to be available for urgent issues outside of office hours, we are not  available 24/7.   If you have an urgent issue and are unable to reach [name], you may choose to seek medical care at your doctor's office, retail clinic, urgent care center, or emergency room.  If you have a medical emergency, please immediately call 911 or go to the emergency department.  Pager Numbers  - Dr. Nehemiah Massed: [phone]  - Dr. Laurence Ferrari: [phone]  - Dr. Nicole Kindred: [phone]  In the event of inclement weather, please call our main line at [phone] for an update on the status of any delays or closures.  Dermatology Medication Tips: Please keep the boxes that topical medications come in in order to help keep track of the instructions about where and how to use these. Pharmacies typically print the medication instructions only on the boxes and not directly on the medication tubes.   If your medication is too expensive, please contact our office at [phone] option 4 or send [name] a message through [place].   We are unable to tell what your co-pay for medications will be in advance as this is different depending on your insurance coverage. However, we may be able to find a substitute medication at lower cost or fill out paperwork to get insurance to cover a needed medication.   If a prior authorization is required to get your medication covered by your insurance company, please allow [name] 1-2 business days to complete this process.  Drug prices often vary depending on where the prescription is filled and some pharmacies may offer cheaper prices.  The website www.goodrx.com contains coupons for medications through different pharmacies. The prices here do not account for what the cost may be with help from insurance (it may be cheaper with your insurance), but the website can give you the price if you did not use any insurance.  - You can print the associated coupon and take it with your prescription to the pharmacy.  - You may also stop by our office during regular business hours  and pick up a GoodRx coupon card.  - If you need your prescription sent electronically to a different pharmacy, notify our office through [facility] or by phone at [phone] option 4.     Si Usted Necesita Algo Despus de Su Visita  Tambin puede enviarnos un mensaje a travs de Pharmacist, community. Por lo general respondemos a los mensajes de MyChart en el transcurso de 1 a 2 das hbiles.  Para renovar recetas, por favor pida a su farmacia que se ponga en contacto con nuestra oficina. Harland Dingwall de fax es [place] [phone].  Si tiene un asunto urgente cuando la clnica est cerrada y que no puede esperar hasta el siguiente da hbil, puede llamar/localizar a su doctor(a) al nmero que aparece a continuacin.   Por favor, tenga en cuenta que aunque hacemos todo lo posible para estar disponibles para asuntos urgentes fuera del horario de  oficina, no estamos disponibles las 24 horas del da, los 7 das de la [place].   Si tiene un problema urgente y no puede comunicarse con nosotros, puede optar por buscar atencin mdica  en el consultorio de su doctor(a), en una clnica privada, en un centro de atencin urgente o en una sala de emergencias.  Si tiene Engineering geologist, por favor llame inmediatamente al 911 o vaya a la sala de emergencias.  Nmeros de bper  - Dr. Nehemiah Massed: [phone]  - Dra. Moye: [phone]  - Dra. Nicole Kindred: [phone]  En caso de inclemencias del [place], por favor llame a Johnsie Kindred principal al [phone] para una actualizacin sobre el [place] de cualquier retraso o cierre.  Consejos para la medicacin en dermatologa: Por favor, guarde las cajas en las que vienen los medicamentos de uso tpico para ayudarle a seguir las instrucciones sobre dnde y cmo usarlos. Las farmacias generalmente imprimen las instrucciones del medicamento slo en las cajas y no directamente en los tubos del [place].   Si su medicamento es muy caro, por favor, pngase  en contacto con Zigmund Daniel llamando al [phone] y presione la opcin 4 o envenos un mensaje a travs de Pharmacist, community.   No podemos decirle cul ser su copago por los medicamentos por adelantado ya que esto es diferente dependiendo de la cobertura de su seguro. Sin embargo, es posible que podamos encontrar un medicamento sustituto a Electrical engineer un formulario para que el seguro cubra el medicamento que se considera necesario.   Si se requiere una autorizacin previa para que su compaa de seguros [place] su medicamento, por favor permtanos de 1 a 2 das hbiles para completar este proceso.  Los precios de los medicamentos varan con frecuencia dependiendo del Environmental consultant de dnde se surte la receta y alguna farmacias pueden ofrecer precios ms baratos.  El sitio web www.goodrx.com tiene cupones para medicamentos de Airline pilot. Los precios aqu no tienen en cuenta lo que podra costar con la ayuda del seguro (puede ser ms barato con su seguro), pero el sitio web puede darle el precio si no utiliz Research scientist (physical sciences).  - Puede imprimir el cupn correspondiente y llevarlo con su receta a la farmacia.  - Tambin puede pasar por nuestra oficina durante el horario de atencin regular y Charity fundraiser una tarjeta de cupones de GoodRx.  - Si necesita que su receta se enve electrnicamente a una farmacia diferente, informe a nuestra oficina a travs de MyChart de [place] o por telfono llamando al [phone] y presione la opcin 4.

## 2022-03-05 NOTE — Progress Notes (Signed)
Follow-Up Visit   Subjective  Brittany Browning is a 81 y.o. female who presents for the following: Annual Exam (Here for skin cancer screening. Full body. HxMIS, HxBCC. Areas of concern today). Spot on foot to check.  Moles to recheck.  The patient presents for Total-Body Skin Exam (TBSE) for skin cancer screening and mole check.  The patient has spots, moles and lesions to be evaluated, some may be new or changing and the patient has concerns that these could be cancer.   The following portions of the chart were reviewed this encounter and updated as appropriate:      Review of Systems: No other skin or systemic complaints except as noted in HPI or Assessment and Plan.   Objective  Well appearing patient in no apparent distress; mood and affect are within normal limits.  A full examination was performed including scalp, head, eyes, ears, nose, lips, neck, chest, axillae, abdomen, back, buttocks, bilateral upper extremities, bilateral lower extremities, hands, feet, fingers, toes, fingernails, and toenails. All findings within normal limits unless otherwise noted below.  Left Foot Dorsum, Right Hip (side) - Posterior, Right Lower Thigh - Anterior, Right Thigh -Lateral right anterior lower thigh 1 mm medium pigmented dark brown macule    right posterior hip 5 mm speckled tan macule    right lateral thigh 3 mm medium dark brown macule   Left foot Dorsum 2.5 mm medium gray-brown macule     Left Foot - dorsum Firm violaceous slightly scaly nodule with underlying varicosity. 1.2 cm   Right Lower Leg - Anterior Violaceous slightly firm plaque   Assessment & Plan   History of Melanoma in Situ. Right medial calf. Excised 06/17/2021. - No evidence of recurrence today - Recommend regular full body skin exams - Recommend daily broad spectrum sunscreen SPF 30+ to sun-exposed areas, reapply every 2 hours as needed.  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 Excised 2017. - No evidence of recurrence today - Recommend regular full body skin exams - Recommend daily broad spectrum sunscreen SPF 30+ to sun-exposed areas, reapply every 2 hours as needed.  - Call if any new or changing lesions are noted between office visits   Lentigines - Scattered tan macules - Due to sun exposure - Benign-appearing, observe - Recommend daily broad spectrum sunscreen SPF 30+ to sun-exposed areas, reapply every 2 hours as needed. - Call for any changes  Seborrheic Keratoses - Stuck-on, waxy, tan-brown papules and/or plaques  - Benign-appearing - Discussed benign etiology and prognosis. - Observe - Call for any changes  Melanocytic Nevi - Tan-brown and/or pink-flesh-colored symmetric macules and papules - Benign appearing on exam today - Observation - Call clinic for new or changing moles - Recommend daily use of broad spectrum spf 30+ sunscreen to sun-exposed areas.   Hemangiomas - Red papules - Discussed benign nature - Observe - Call for any changes  Actinic Damage. Chest, arms, legs. - Chronic condition, secondary to cumulative UV/sun exposure - diffuse scaly erythematous macules with underlying dyspigmentation - Recommend daily broad spectrum sunscreen SPF 30+ to sun-exposed areas, reapply every 2 hours as needed.  - Staying in the shade or wearing long sleeves, sun glasses (UVA+UVB protection) and wide brim hats (4-inch brim around the entire circumference of the hat) are also recommended for sun protection.  - Call for new or changing lesions.  Skin cancer  screening performed today.  Varicose Veins/Spider Veins - Dilated blue, purple or red veins at the lower extremities - Reassured - Smaller vessels can be treated by sclerotherapy (a procedure to inject a medicine into the veins to make them disappear) if desired, but the treatment is not covered by  insurance. Larger vessels may be covered if symptomatic and we would refer to vascular surgeon if treatment desired.  Dermatofibroma. Left posterior upper arm.  - Firm pink/brown papulenodule with dimple sign - Benign appearing - Call for any changes   Nevus (4) Left Foot Dorsum; Right Hip (side) - Posterior; Right Lower Thigh - Anterior; Right Thigh -Lateral  Benign-appearing.  Stable. Observation.  Call clinic for new or changing lesions.  Recommend daily use of broad spectrum spf 30+ sunscreen to sun-exposed areas.   Photos compared from last visit. No changes noted.   Callus Left Foot - dorsum  Vs thrombophlebitis   Benign-appearing.  Observation.  Call clinic for new or changing lesions.  Recommend daily use of broad spectrum spf 30+ sunscreen to sun-exposed areas.     Scar Right Lower Leg - Anterior  Secondary to MIS surgery.  Recommend using Serica Scar Gel bid as directed on label   Return in about 6 months (around 09/05/2022) for TBSE, HxMIS.  I, Emelia Salisbury, CMA, am acting as scribe for Brendolyn Patty, MD.  Documentation: I have reviewed the above documentation for accuracy and completeness, and I agree with the above.  Brendolyn Patty MD

## 2022-06-02 ENCOUNTER — Encounter (INDEPENDENT_AMBULATORY_CARE_PROVIDER_SITE_OTHER): Payer: Self-pay

## 2022-09-01 ENCOUNTER — Telehealth: Payer: Self-pay

## 2022-09-01 NOTE — Telephone Encounter (Signed)
Patient called and left a voicemail and states her PCP referred her over to have a EGD done with our office. Looked on the fax and referral basket and we had not received a referral. Called patient back and informed her that we have not received a referral she states she will call her PCP back and get them to fax the referral over. Gave her our fax number.

## 2022-09-09 ENCOUNTER — Ambulatory Visit (INDEPENDENT_AMBULATORY_CARE_PROVIDER_SITE_OTHER): Payer: Medicare Other | Admitting: Dermatology

## 2022-09-09 VITALS — BP 104/69 | HR 49

## 2022-09-09 DIAGNOSIS — L281 Prurigo nodularis: Secondary | ICD-10-CM | POA: Diagnosis not present

## 2022-09-09 DIAGNOSIS — D2362 Other benign neoplasm of skin of left upper limb, including shoulder: Secondary | ICD-10-CM

## 2022-09-09 DIAGNOSIS — I8310 Varicose veins of unspecified lower extremity with inflammation: Secondary | ICD-10-CM

## 2022-09-09 DIAGNOSIS — D2271 Melanocytic nevi of right lower limb, including hip: Secondary | ICD-10-CM | POA: Diagnosis not present

## 2022-09-09 DIAGNOSIS — D2272 Melanocytic nevi of left lower limb, including hip: Secondary | ICD-10-CM

## 2022-09-09 DIAGNOSIS — Z85828 Personal history of other malignant neoplasm of skin: Secondary | ICD-10-CM

## 2022-09-09 DIAGNOSIS — Z1283 Encounter for screening for malignant neoplasm of skin: Secondary | ICD-10-CM

## 2022-09-09 DIAGNOSIS — D229 Melanocytic nevi, unspecified: Secondary | ICD-10-CM

## 2022-09-09 DIAGNOSIS — L578 Other skin changes due to chronic exposure to nonionizing radiation: Secondary | ICD-10-CM

## 2022-09-09 DIAGNOSIS — L821 Other seborrheic keratosis: Secondary | ICD-10-CM

## 2022-09-09 DIAGNOSIS — L814 Other melanin hyperpigmentation: Secondary | ICD-10-CM

## 2022-09-09 DIAGNOSIS — Z86006 Personal history of melanoma in-situ: Secondary | ICD-10-CM

## 2022-09-09 MED ORDER — CLOBETASOL PROPIONATE 0.05 % EX OINT: TOPICAL_OINTMENT | CUTANEOUS | 0 refills | Status: DC

## 2022-09-09 NOTE — Patient Instructions (Addendum)
Recommend OTC Gold Bond Rapid Relief Anti-Itch cream (pramoxine + menthol), CeraVe Anti-itch cream or lotion (pramoxine), Sarna lotion (Original- menthol + camphor or Sensitive- pramoxine) or Eucerin 12 hour Itch Relief lotion (menthol) up to 3 times per day to areas on body that are itchy.    Basic OTC daily skin care regimen to prevent photoaging:   Recommend facial moisturizer with sunscreen SPF 30 every morning (OTC brands include CeraVe AM, Neutrogena, Eucerin, Cetaphil, Aveeno, La Roche Posay).  Can also apply a topical Vit C serum which is an antioxidant (OTC brands include CeraVe, La Roche Posay, and The Ordinary) underneath sunscreen in morning. If you are outside during the day in the summer for extended periods, especially swimming and/or sweating, make sure you apply a water resistant facial sunscreen lotion spf 30 or higher.   At night recommend a cream with retinol (a vitamin A derivative which stimulates collagen production) like CeraVe skin renewing retinol serum or ROC retinol correxion cream or Neutrogena rapid wrinkle repair cream. Retinol may cause skin irritation in people with sensitive skin.  Can use it every other day and/or apply on top of a hyaluronic acid (HA) moisturizer/serum (Neutrogena Hydroboost water cream) if better tolerated that way.  Retinol may also help with lightening brown spots.   Our office sells high quality, medically tested skin care lines such as Elta MD sunscreens (with Zinc), and Alastin skin care products, which are very effective in treating photoaging. The Alastin line includes cosmeceutical grade Vit.C serum, HA serum, Elastin stimulating moisturizers/serums, lightening serum, and sunscreens.  If you want prescription treatment, then you would need an appointment (Rx tretinoin and fade creams, Botox, filler injections, laser treatments, etc.) These prescriptions and procedures are not covered by insurance but work very well.    Gentle Skin Care  Guide  1. Bathe no more than once a day.  2. Avoid bathing in hot water  3. Use a mild soap like Dove, Vanicream, Cetaphil, CeraVe. Can use Lever 2000 or Cetaphil antibacterial soap  4. Use soap only where you need it. On most days, use it under your arms, between your legs, and on your feet. Let the water rinse other areas unless visibly dirty.  5. When you get out of the bath/shower, use a towel to gently blot your skin dry, don't rub it.  6. While your skin is still a little damp, apply a moisturizing cream such as Vanicream, CeraVe, Cetaphil, Eucerin, Sarna lotion or plain Vaseline Jelly. For hands apply Neutrogena [place] Hand Cream or Excipial Hand Cream.  7. Reapply moisturizer any time you start to itch or feel dry.  8. Sometimes using free and clear laundry detergents can be helpful. Fabric softener sheets should be avoided. Downy Free & Gentle liquid, or any liquid fabric softener that is free of dyes and perfumes, it acceptable to use  9. If your doctor has given you prescription creams you may apply moisturizers over them     Due to recent changes in healthcare laws, you may see results of your pathology and/or laboratory studies on MyChart before the doctors have had a chance to review them. We understand that in some cases there may be results that are confusing or concerning to you. Please understand that not all results are received at the same time and often the doctors may need to interpret multiple results in order to provide you with the best plan of care or course of treatment. Therefore, we ask that you please give [name]  2 business days to thoroughly review all your results before contacting the office for clarification. Should we see a critical lab result, you will be contacted sooner.   If You Need Anything After Your Visit  If you have any questions or concerns for your doctor, please call our main line at [phone] and press option 4 to reach your doctor's  medical assistant. If no one answers, please leave a voicemail as directed and we will return your call as soon as possible. Messages left after 4 pm will be answered the following business day.   You may also send [name] a message via Archie. We typically respond to MyChart messages within 1-2 business days.  For prescription refills, please ask your pharmacy to contact our office. Our fax number is [phone].  If you have an urgent issue when the clinic is closed that cannot wait until the next business day, you can page your doctor at the number below.    Please note that while we do our best to be available for urgent issues outside of office hours, we are not available 24/7.   If you have an urgent issue and are unable to reach [name], you may choose to seek medical care at your doctor's office, retail clinic, urgent care center, or emergency room.  If you have a medical emergency, please immediately call 911 or go to the emergency department.  Pager Numbers  - Dr. Nehemiah Massed: [phone]  - Dr. Laurence Ferrari: [phone]  - Dr. Nicole Kindred: [phone]  In the event of inclement weather, please call our main line at [phone] for an update on the status of any delays or closures.  Dermatology Medication Tips: Please keep the boxes that topical medications come in in order to help keep track of the instructions about where and how to use these. Pharmacies typically print the medication instructions only on the boxes and not directly on the medication tubes.   If your medication is too expensive, please contact our office at [phone] option 4 or send [name] a message through [place].   We are unable to tell what your co-pay for medications will be in advance as this is different depending on your insurance coverage. However, we may be able to find a substitute medication at lower cost or fill out paperwork to get insurance to cover a needed medication.   If a prior authorization is required to  get your medication covered by your insurance company, please allow [name] 1-2 business days to complete this process.  Drug prices often vary depending on where the prescription is filled and some pharmacies may offer cheaper prices.  The website www.goodrx.com contains coupons for medications through different pharmacies. The prices here do not account for what the cost may be with help from insurance (it may be cheaper with your insurance), but the website can give you the price if you did not use any insurance.  - You can print the associated coupon and take it with your prescription to the pharmacy.  - You may also stop by our office during regular business hours and pick up a GoodRx coupon card.  - If you need your prescription sent electronically to a different pharmacy, notify our office through [facility] or by phone at [phone] option 4.     Si Usted Necesita Algo Despus de Su Visita  Tambin puede enviarnos un mensaje a travs de Pharmacist, community. Por lo general respondemos a los mensajes de MyChart en el transcurso de 1 a 2  das hbiles.  Para renovar recetas, por favor pida a su farmacia que se ponga en contacto con nuestra oficina. Harland Dingwall de fax es [place] [phone].  Si tiene un asunto urgente cuando la clnica est cerrada y que no puede esperar hasta el siguiente da hbil, puede llamar/localizar a su doctor(a) al nmero que aparece a continuacin.   Por favor, tenga en cuenta que aunque hacemos todo lo posible para estar disponibles para asuntos urgentes fuera del horario de [place], no estamos disponibles las 24 horas del da, los 7 das de la [place].   Si tiene un problema urgente y no puede comunicarse con nosotros, puede optar por buscar atencin mdica  en el consultorio de su doctor(a), en una clnica privada, en un centro de atencin urgente o en una sala de emergencias.  Si tiene Engineering geologist, por favor llame inmediatamente al 911 o vaya a la sala de  emergencias.  Nmeros de bper  - Dr. Nehemiah Massed: [phone]  - Dra. Moye: [phone]  - Dra. Nicole Kindred: [phone]  En caso de inclemencias del [place], por favor llame a Johnsie Kindred principal al [phone] para una actualizacin sobre el [place] de cualquier retraso o cierre.  Consejos para la medicacin en dermatologa: Por favor, guarde las cajas en las que vienen los medicamentos de uso tpico para ayudarle a seguir las instrucciones sobre dnde y cmo usarlos. Las farmacias generalmente imprimen las instrucciones del medicamento slo en las cajas y no directamente en los tubos del [place].   Si su medicamento es muy caro, por favor, pngase en contacto con Zigmund Daniel llamando al [phone] y presione la opcin 4 o envenos un mensaje a travs de Pharmacist, community.   No podemos decirle cul ser su copago por los medicamentos por adelantado ya que esto es diferente dependiendo de la cobertura de su seguro. Sin embargo, es posible que podamos encontrar un medicamento sustituto a Electrical engineer un formulario para que el seguro cubra el medicamento que se considera necesario.   Si se requiere una autorizacin previa para que su compaa de seguros [place] su medicamento, por favor permtanos de 1 a 2 das hbiles para completar este proceso.  Los precios de los medicamentos varan con frecuencia dependiendo del Environmental consultant de dnde se surte la receta y alguna farmacias pueden ofrecer precios ms baratos.  El sitio web www.goodrx.com tiene cupones para medicamentos de Airline pilot. Los precios aqu no tienen en cuenta lo que podra costar con la ayuda del seguro (puede ser ms barato con su seguro), pero el sitio web puede darle el precio si no utiliz Research scientist (physical sciences).  - Puede imprimir el cupn correspondiente y llevarlo con su receta a la farmacia.  - Tambin puede pasar por nuestra oficina durante el horario de atencin regular y Charity fundraiser una tarjeta de cupones de GoodRx.  - Si  necesita que su receta se enve electrnicamente a una farmacia diferente, informe a nuestra oficina a travs de MyChart de [place] o por telfono llamando al [phone] y presione la opcin 4.

## 2022-09-09 NOTE — Progress Notes (Signed)
Follow-Up Visit   Subjective  Brittany Browning is a 82 y.o. female who presents for the following: Follow-up. 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  The patient presents for 6 month follow-up and Total-Body Skin Exam (TBSE) for skin cancer screening and mole check.  The patient has spots, moles and lesions to be evaluated, some may be new or changing and the patient has concerns that these could be cancer.   The following portions of the chart were reviewed this encounter and updated as appropriate:       Review of Systems:  No other skin or systemic complaints except as noted in HPI or Assessment and Plan.  Objective  Well appearing patient in no apparent distress; mood and affect are within normal limits.  A full examination was performed including scalp, head, eyes, ears, nose, lips, neck, chest, axillae, abdomen, back, buttocks, bilateral upper extremities, bilateral lower extremities, hands, feet, fingers, toes, fingernails, and toenails. All findings within normal limits unless otherwise noted below.  right anterior lower thigh; right posterior hip; right lateral thigh; left foot dorsum right anterior lower thigh 1 mm medium dark brown macule    right posterior hip 5 mm speckled tan macule    right lateral thigh 3 mm medium dark brown macule    Left foot Dorsum 2.5 mm medium gray-brown macule  right posterior shoulder Pink brown scaly papule with excoriation    Assessment & Plan  Skin cancer screening performed today.  Actinic Damage - chronic, secondary to cumulative UV radiation exposure/sun exposure over time - diffuse scaly erythematous macules with underlying dyspigmentation - Recommend daily broad spectrum sunscreen SPF 30+ to sun-exposed areas, reapply every 2 hours as needed.  - Recommend staying in the shade or wearing long sleeves, sun glasses (UVA+UVB protection) and wide brim hats (4-inch brim around the entire circumference of the hat). - Call for new or changing  lesions.  Lentigines - Scattered tan macules - Due to sun exposure - Benign-appearing, observe - Recommend daily broad spectrum sunscreen SPF 30+ to sun-exposed areas, reapply every 2 hours as needed. - Call for any changes  Seborrheic Keratoses - Stuck-on, waxy, tan-brown papules and/or plaques  - Benign-appearing - Discussed benign etiology and prognosis. - Observe - Call for any changes  History of Basal Cell Carcinoma of the Skin - No evidence of recurrence today of the right spinal mid upper back - Recommend regular full body skin exams - Recommend daily broad spectrum sunscreen SPF 30+ to sun-exposed areas, reapply every 2 hours as needed.  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  - Call if any new or changing lesions are noted between office visits  Varicose Veins/Spider Veins - Dilated blue, purple or red veins at the lower extremities - Reassured - Smaller vessels can be treated by sclerotherapy (a procedure to inject a medicine into the veins to make them disappear) if desired, but the treatment is not covered by insurance. Larger vessels may be covered if symptomatic and we would refer to vascular surgeon if treatment desired.  Dermatofibroma - Firm pink/brown papulenodule with dimple sign, left post upper arm - Benign appearing - Call for any changes  Xerosis - diffuse xerotic patches - recommend gentle, hydrating  skin care - gentle skin care handout given  Nevus right anterior lower thigh; right posterior hip; right lateral thigh; left foot dorsum  Benign-appearing, stable.  Observation.  Call clinic for new or changing moles.  Recommend daily use of broad spectrum spf 30+ sunscreen to  sun-exposed areas.   Prurigo nodularis right posterior shoulder  Benign-appearing.  Observation.  Call clinic for new or changing lesions.    Start Clobetasol Ointment Apply to AA R post shoulder BID until improved dsp 15g 0Rf. Avoid face, groin, axilla. Topical steroids (such as triamcinolone, fluocinolone, fluocinonide, mometasone, clobetasol, halobetasol, betamethasone, hydrocortisone) can cause thinning and lightening of the skin if they are used for too long in the same area. Your physician has selected the right strength medicine for your problem and area affected on the body. Please use your medication only as directed by your physician to prevent side effects.   Avoid picking, scratching  If doesn't clear in 2 months, patient to return for biopsy.   clobetasol ointment (TEMOVATE) 0.05 % - right posterior shoulder Apply to bump on right upper back twice a day until improved. Avoid face, groin, axilla.   Return in about 6 months (around 03/10/2023) for TBSE.  Documentation: I have reviewed the above documentation for accuracy and completeness, and I agree with the above.  Brendolyn Patty MD

## 2022-11-28 ENCOUNTER — Telehealth: Payer: Self-pay | Admitting: Gastroenterology

## 2022-11-28 ENCOUNTER — Other Ambulatory Visit: Payer: Self-pay

## 2022-11-28 NOTE — Telephone Encounter (Signed)
Patient is not schedule for a procedure just a office visit.Marland Kitchen Called informed patient of this information and she verbalized understanding of instructions

## 2022-11-28 NOTE — Telephone Encounter (Signed)
Pt left message has questions to her upcoming procedure 12/02/2022 please return call

## 2022-12-02 ENCOUNTER — Other Ambulatory Visit: Payer: Self-pay

## 2022-12-02 ENCOUNTER — Encounter: Payer: Self-pay | Admitting: Gastroenterology

## 2022-12-02 ENCOUNTER — Ambulatory Visit (INDEPENDENT_AMBULATORY_CARE_PROVIDER_SITE_OTHER): Payer: Medicare Other | Admitting: Gastroenterology

## 2022-12-02 VITALS — BP 117/79 | HR 91 | Temp 97.5°F | Ht 66.0 in | Wt 142.5 lb

## 2022-12-02 DIAGNOSIS — R1319 Other dysphagia: Secondary | ICD-10-CM

## 2022-12-02 DIAGNOSIS — R1310 Dysphagia, unspecified: Secondary | ICD-10-CM

## 2022-12-02 NOTE — Progress Notes (Signed)
Brittany Repress, MD [address]  [address]  [place], [place] [address]  Main: [phone]  Fax: [phone]    Gastroenterology Consultation  Referring Provider:     Mechele Claude, MD Primary Care Physician:  Mechele Claude, MD Primary Gastroenterologist:  Dr. Arlyss Browning Reason for Consultation: Dysphagia   HPI:   Brittany Browning is a 82 y.o. female referred by Dr. Mechele Claude, MD  for consultation & management of dysphagia.  Patient reports that she has been experiencing trouble swallowing pills and solid food including meat and certain vegetables.  Symptoms have started about 2 years ago.  She does not have any issues with liquids.  She has been taking omeprazole 15 minutes before a meal and tries to take pills throughout the day, swallow carefully.  She also feels dryness in her mouth and wondering if her trouble swallowing is related to dryness due to medications.  Trying to drink more water.  She also takes Zofran as needed.  She underwent barium swallow in 07/2022 which was normal.  She has restless legs.  No history of anemia or iron deficiency.  Apparently, she is on several supplements including magnesium, potassium and several others.  Not sure why she is taking and she said her doctors tell her to do so  She does not smoke or drink alcohol She is functionally independent  NSAIDs: None  Antiplts/Anticoagulants/Anti thrombotics: None  GI Procedures: Colonoscopy 2014 reportedly normal  Past Medical History:  Diagnosis Date   Anemia    Anxiety    Arthritis    osteoarthritis   Basal cell carcinoma (BCC) 02/25/2016   Right spinal mid upper back. Superficial. Excised 04/14/2016, margins free.   Cataract    Complication of anesthesia    has had problems in the past. 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   R med calf, EXC 06/17/2021   Osteopenia    Restless leg syndrome    Stress  incontinence    Willis-Ekbom syndrome     Past Surgical History:  Procedure Laterality Date   CATARACT EXTRACTION W/PHACO Left 10/20/2017   Procedure: CATARACT EXTRACTION PHACO AND INTRAOCULAR LENS PLACEMENT (IOC);  Surgeon: Galen Manila, MD;  Location: [facility] OR[facility];  Service: Ophthalmology;  Laterality: Left;  [name] 00:39.7 AP% 12.9 CDE 5.09 Fluid Pack Lot # [id number]   CATARACT EXTRACTION W/PHACO Right 02/19/2021   Procedure: CATARACT EXTRACTION PHACO AND INTRAOCULAR LENS PLACEMENT (IOC) RIGHT;  Surgeon: Galen Manila, MD;  Location: [facility];  Service: Ophthalmology;  Laterality: Right;  6.15 0:38.6   COLONOSCOPY     SKIN CANCER EXCISION     TONSILLECTOMY AND ADENOIDECTOMY  1944     Current Outpatient Medications:    Azelastine HCl 0.15 % SOLN, Place 205.5 application into the nose. Place 2 sprays into both nostrils nightly, Disp: , Rfl:    CALCIUM PO, Take 1,400 mg by mouth daily., Disp: , Rfl:    cevimeline (EVOXAC) 30 MG capsule, Take 30 mg by mouth 3 (three) times daily., Disp: , Rfl:    Cholecalciferol (VITAMIN D-3 PO), Take by mouth., Disp: , Rfl:    Cinnamon 500 MG capsule, Take 2 capsules by mouth daily. , Disp: , Rfl:    dipyridamole (PERSANTINE) 50 MG tablet, Take by mouth., Disp: , Rfl:    Docusate Calcium (STOOL SOFTENER PO), Stool Softener, Disp: , Rfl:    Estradiol 10 MCG TABS vaginal tablet,  Place 10 mcg vaginally., Disp: , Rfl:    fluocinonide cream (LIDEX) 0.05 %, , Disp: , Rfl:    furosemide (LASIX) 20 MG tablet, Take 20 mg by mouth daily., Disp: , Rfl:    Glucosamine-Chondroitin (COSAMIN DS PO), Take by mouth., Disp: , Rfl:    Hypertonic Nasal Wash (SINUS RINSE NA), Place 1 application into the nose daily., Disp: , Rfl:    ketotifen (ZADITOR) 0.025 % ophthalmic solution, Apply 1 drop to eye 2 (two) times daily as needed., Disp: , Rfl:    levocetirizine (XYZAL) 5 MG tablet, Take 5 mg by mouth every evening., Disp: , Rfl:    MAGNESIUM GLUCONATE PO,  Take 140 mg by mouth daily., Disp: , Rfl:    montelukast (SINGULAIR) 10 MG tablet, Take 1 tablet by mouth daily., Disp: , Rfl:    Multiple Vitamins-Minerals (PRESERVISION AREDS PO), Take by mouth daily., Disp: , Rfl:    MYRBETRIQ 50 MG TB24 tablet, Take 50 mg by mouth daily., Disp: , Rfl:    NONFORMULARY OR COMPOUNDED ITEM, Cream with cannabis oil, Disp: , Rfl:    omeprazole (PRILOSEC) 40 MG capsule, Take 0.5 tablets by mouth daily., Disp: , Rfl:    ondansetron (ZOFRAN-ODT) 4 MG disintegrating tablet, Take 4 mg by mouth every 8 (eight) hours as needed., Disp: , Rfl:    OXYGEN, Inhale 2 L into the lungs at bedtime., Disp: , Rfl:    pramipexole (MIRAPEX) 0.125 MG tablet, Take by mouth., Disp: , Rfl:    Probiotic Product (PROBIOTIC PO), Take by mouth daily., Disp: , Rfl:    Rotigotine 1 MG/24HR PT24, Place onto the skin., Disp: , Rfl:    Family History  Problem Relation Age of Onset   Varicose Veins Mother    Stroke Maternal Grandfather    Breast cancer Neg Hx      Social History   Tobacco Use   Smoking status: Never   Smokeless tobacco: Never  Vaping Use   Vaping Use: Never used  Substance Use Topics   Alcohol use: No   Drug use: No    Allergies as of 12/02/2022 - Review Complete 12/02/2022  Allergen Reaction Noted   Bupropion Anxiety and Other (See Comments) 06/03/2017   Lactose intolerance (gi) Other (See Comments) 10/06/2017   Sulfa antibiotics Other (See Comments) 12/31/2016   Zolpidem Other (See Comments) 10/06/2017    Review of Systems:    All systems reviewed and negative except where noted in HPI.   Physical Exam:  BP 117/79 (BP Location: Left Arm, Patient Position: Sitting, Cuff Size: Normal)   Pulse 91   Temp (!) 97.5 F (36.4 C) (Oral)   Ht 5\' 6"  (1.676 m)   Wt 142 lb 8 oz (64.6 kg)   BMI 23.00 kg/m  No LMP recorded. Patient is postmenopausal.  General:   Alert,  Well-developed, well-nourished, pleasant and cooperative in NAD Head:  Normocephalic and  atraumatic. Eyes:  Sclera clear, no icterus.   Conjunctiva pink. Ears:  Normal auditory acuity. Nose:  No deformity, discharge, or lesions. Mouth:  No deformity or lesions,oropharynx pink & moist. Neck:  Supple; no masses or thyromegaly. Lungs:  Respirations even and unlabored.  Clear throughout to auscultation.   No wheezes, crackles, or rhonchi. No acute distress. Heart:  Regular rate and rhythm; no murmurs, clicks, rubs, or gallops. Abdomen:  Normal bowel sounds. Soft, non-tender and non-distended without masses, hepatosplenomegaly or hernias noted.  No guarding or rebound tenderness.   Rectal: Not performed Msk:  Symmetrical without gross deformities. Good, equal movement & strength bilaterally. Pulses:  Normal pulses noted. Extremities:  No clubbing or edema.  No cyanosis. Neurologic:  Alert and oriented x3;  grossly normal neurologically. Skin:  Intact without significant lesions or rashes. No jaundice. Psych:  Alert and cooperative. Normal mood and affect.  Imaging Studies: Reviewed  Assessment and Plan:   Nikyah Lackman is a 82 y.o. female with history of anxiety, arthritis is seen in consultation for 2 years history of dysphagia to solids and pills, on low-dose omeprazole, barium x-ray esophagus was normal  Discussed with patient regarding upper endoscopy for further evaluation with possible esophageal dilation, rule out any structural causes in her esophagus   Follow up based on the above workup   Brittany Repress, MD

## 2022-12-04 ENCOUNTER — Telehealth: Payer: Self-pay

## 2022-12-04 NOTE — Telephone Encounter (Signed)
Per Dava in Endo above pt takes persantine (dipyridamole) for blood clots. does she need to hold this med any days prior to procedure. please f/u with pt. it is an old blood thinner. thanks

## 2022-12-05 ENCOUNTER — Telehealth: Payer: Self-pay

## 2022-12-05 NOTE — Telephone Encounter (Signed)
Faxed it to this number and got confirmation

## 2022-12-05 NOTE — Telephone Encounter (Signed)
Patient asked if she could just stop the medication her self. She states she takes the medication for restless legs and has been on it for many years. She asked if anything would happen to her if she stopped the medication. Informed patient I had to get doctors orders for her to stop the medication. Asked her who prescribed the medication and she states [facility].  Faxed this to them at [phone]. Patient states she will also call there office this morning also.

## 2022-12-05 NOTE — Telephone Encounter (Signed)
Per pt please send fax to [phone] Dr. Quintin Alto or np Gray Bernhardt

## 2022-12-10 ENCOUNTER — Telehealth: Payer: Self-pay

## 2022-12-10 NOTE — Telephone Encounter (Addendum)
Called [facility] at [phone] and talk to the nurse and she state Dr. Mindi Junker states to stop the dipyridamole 50mg   2 days before procedure and then restart it 1 day after the procedure.  This is documented in a telephone call on 12/08/2022 Lamount Cranker, MD - 12/08/2022 8:19 AM EDT Formatting of this note might be different from the original. [place] [facility] requested guidance about what to do with dipyridamole during her EGD. I advised stopping it 2 days in advance and restarting 1 the following day. Electronically signed by Lamount Cranker, MD at 12/08/2022 8:20 AM EDT  Informed them I will need a filled out copy of the form faxed to [name]. She states she will get the provider to fill it out and fax it to [name].   Called and left a message for call back for patient to inform her of this information.   Patient called back and verbalized understanding of instructions

## 2022-12-15 ENCOUNTER — Encounter: Payer: Self-pay | Admitting: Gastroenterology

## 2022-12-15 ENCOUNTER — Ambulatory Visit: Payer: Medicare Other | Admitting: Registered Nurse

## 2022-12-15 ENCOUNTER — Encounter
Admission: RE | Disposition: A | Discharge status: Home / Self Care | Payer: Self-pay | Source: Home / Self Care | Attending: Gastroenterology

## 2022-12-15 ENCOUNTER — Ambulatory Visit
Admission: RE | Admit: 2022-12-15 | Discharge: 2022-12-15 | Disposition: A | Discharge status: Home / Self Care | Payer: Medicare Other | Attending: Gastroenterology | Admitting: Gastroenterology

## 2022-12-15 DIAGNOSIS — R1314 Dysphagia, pharyngoesophageal phase: Secondary | ICD-10-CM | POA: Insufficient documentation

## 2022-12-15 DIAGNOSIS — Z79899 Other long term (current) drug therapy: Secondary | ICD-10-CM | POA: Insufficient documentation

## 2022-12-15 DIAGNOSIS — G4730 Sleep apnea, unspecified: Secondary | ICD-10-CM | POA: Diagnosis not present

## 2022-12-15 DIAGNOSIS — K31819 Angiodysplasia of stomach and duodenum without bleeding: Secondary | ICD-10-CM | POA: Diagnosis not present

## 2022-12-15 DIAGNOSIS — G2581 Restless legs syndrome: Secondary | ICD-10-CM | POA: Insufficient documentation

## 2022-12-15 DIAGNOSIS — Z7902 Long term (current) use of antithrombotics/antiplatelets: Secondary | ICD-10-CM | POA: Insufficient documentation

## 2022-12-15 DIAGNOSIS — R1310 Dysphagia, unspecified: Secondary | ICD-10-CM | POA: Diagnosis not present

## 2022-12-15 DIAGNOSIS — F419 Anxiety disorder, unspecified: Secondary | ICD-10-CM | POA: Diagnosis not present

## 2022-12-15 DIAGNOSIS — F32A Depression, unspecified: Secondary | ICD-10-CM | POA: Insufficient documentation

## 2022-12-15 DIAGNOSIS — M8580 Other specified disorders of bone density and structure, unspecified site: Secondary | ICD-10-CM | POA: Insufficient documentation

## 2022-12-15 DIAGNOSIS — K2289 Other specified disease of esophagus: Secondary | ICD-10-CM | POA: Diagnosis not present

## 2022-12-15 HISTORY — PX: ESOPHAGOGASTRODUODENOSCOPY (EGD) WITH PROPOFOL: SHX5813

## 2022-12-15 SURGERY — ESOPHAGOGASTRODUODENOSCOPY (EGD) WITH PROPOFOL
Anesthesia: General

## 2022-12-15 MED ORDER — AZELASTINE HCL 0.15 % NA SOLN: 2.0000 | NASAL | Status: AC | PRN

## 2022-12-15 MED ORDER — PROPOFOL 10 MG/ML IV BOLUS
INTRAVENOUS | Status: DC | PRN
  Administered 2022-12-15: 80 mg via INTRAVENOUS

## 2022-12-15 MED ORDER — PROPOFOL 500 MG/50ML IV EMUL
INTRAVENOUS | Status: DC | PRN
  Administered 2022-12-15: 150 ug/kg/min via INTRAVENOUS

## 2022-12-15 MED ORDER — PHENYLEPHRINE HCL (PRESSORS) 10 MG/ML IV SOLN
INTRAVENOUS | Status: DC | PRN
  Administered 2022-12-15: 160 ug via INTRAVENOUS

## 2022-12-15 MED ORDER — SODIUM CHLORIDE 0.9 % IV SOLN
INTRAVENOUS | Status: DC
  Administered 2022-12-15: 1000 mL via INTRAVENOUS

## 2022-12-15 MED ORDER — OMEPRAZOLE 40 MG PO CPDR: 40.0000 mg | DELAYED_RELEASE_CAPSULE | Freq: Every day | ORAL | 2 refills | Status: AC

## 2022-12-15 NOTE — Anesthesia Preprocedure Evaluation (Signed)
Anesthesia Evaluation  Patient identified by MRN, date of birth, ID band Patient awake    Reviewed: Allergy & Precautions, NPO status , Patient's Chart, lab work & pertinent test results  History of Anesthesia Complications Negative for: history of anesthetic complications  Airway Mallampati: II   Neck ROM: Full    Dental no notable dental hx.    Pulmonary sleep apnea and Continuous Positive Airway Pressure Ventilation    Pulmonary exam normal breath sounds clear to auscultation       Cardiovascular negative cardio ROS Normal cardiovascular exam Rhythm:Regular Rate:Normal     Neuro/Psych  PSYCHIATRIC DISORDERS Anxiety Depression    On Suboxone for RLS    GI/Hepatic negative GI ROS,,,  Endo/Other  negative endocrine ROS    Renal/GU negative Renal ROS     Musculoskeletal   Abdominal   Peds  Hematology  (+) Blood dyscrasia, anemia   Anesthesia Other Findings   Reproductive/Obstetrics                             Anesthesia Physical Anesthesia Plan  ASA: 2  Anesthesia Plan: General   Post-op Pain Management:    Induction: Intravenous  PONV Risk Score and Plan: 3 and Propofol infusion, TIVA and Treatment may vary due to age or medical condition  Airway Management Planned: Natural Airway  Additional Equipment:   Intra-op Plan:   Post-operative Plan:   Informed Consent: I have reviewed the patients History and Physical, chart, labs and discussed the procedure including the risks, benefits and alternatives for the proposed anesthesia with the patient or authorized representative who has indicated his/her understanding and acceptance.   Patient has DNR.  Discussed DNR with patient and Continue DNR.     Plan Discussed with: CRNA  Anesthesia Plan Comments: (LMA/GETA backup discussed.  Patient consented for risks of anesthesia including but not limited to:  - adverse reactions to  medications - damage to eyes, teeth, lips or other oral mucosa - nerve damage due to positioning  - sore throat or hoarseness - damage to heart, brain, nerves, lungs, other parts of body or loss of life  Informed patient about role of CRNA in peri- and intra-operative care.  Patient voiced understanding.)       Anesthesia Quick Evaluation

## 2022-12-15 NOTE — Anesthesia Postprocedure Evaluation (Signed)
Anesthesia Post Note  Patient: Brittany Browning  Procedure(s) Performed: ESOPHAGOGASTRODUODENOSCOPY (EGD) WITH PROPOFOL  Patient location during evaluation: PACU Anesthesia Type: General Level of consciousness: awake and alert, oriented and patient cooperative Pain management: pain level controlled Vital Signs Assessment: post-procedure vital signs reviewed and stable Respiratory status: spontaneous breathing, nonlabored ventilation and respiratory function stable Cardiovascular status: blood pressure returned to baseline and stable Postop Assessment: adequate PO intake Anesthetic complications: no   No notable events documented.   Last Vitals:  Vitals:   12/15/22 1145 12/15/22 1155  BP: 116/71 124/72  Pulse: 69 69  Resp: 12 10  Temp:    SpO2: 98% 96%    Last Pain:  Vitals:   12/15/22 1155  TempSrc:   PainSc: 0-No pain                 Reed Breech

## 2022-12-15 NOTE — Transfer of Care (Signed)
Anesthesia Post Note  Patient: Brittany Browning  Procedure(s) Performed: Procedure(s) (LRB): ESOPHAGOGASTRODUODENOSCOPY (EGD) WITH PROPOFOL (N/A)  Anesthesia type: General  Patient location: ICU  Post pain: Pain level controlled  Post assessment: Post-op Vital signs reviewed  Last Vitals:  Vitals:   12/15/22 1059  BP: 120/85  Pulse: 77  Resp: 18  Temp: 36.6 C  SpO2: 97%    Post vital signs: stable  Level of consciousness: Patient remains intubated per anesthesia plan  Complications: No apparent anesthesia complications

## 2022-12-15 NOTE — Op Note (Signed)
[facility]astroenterology Patient Name: Brittany Browning Procedure Date: 12/15/2022 11:16 AM MRN: [id number] Account #: [id number] Date of Birth: 12/03/1940 Admit Type: Outpatient Age: 82 Room: [facility] ENDO ROOM 4 Gender: Female Note Status: Finalized Instrument Name: Upper Endoscope [id number] Procedure:             Upper GI endoscopy Indications:           Esophageal dysphagia Providers:             Toney Reil MD, MD Referring MD:          Mechele Claude (Referring MD) Medicines:             General Anesthesia Complications:         No immediate complications. Estimated blood loss: None. Procedure:             Pre-Anesthesia Assessment:                        - Prior to the procedure, a History and Physical was                         performed, and patient medications and allergies were                         reviewed. The patient is competent. The risks and                         benefits of the procedure and the sedation options and                         risks were discussed with the patient. All questions                         were answered and informed consent was obtained.                         Patient identification and proposed procedure were                         verified by the physician, the nurse, the                         anesthesiologist, the anesthetist and the technician                         in the pre-procedure area in the procedure room in the                         endoscopy suite. Mental Status Examination: alert and                         oriented. Airway Examination: normal oropharyngeal                         airway and neck mobility. Respiratory Examination:                         clear to auscultation. CV Examination: normal.  Prophylactic Antibiotics: The patient does not require                         prophylactic antibiotics. Prior Anticoagulants: The                         patient  has taken no anticoagulant or antiplatelet                         agents. ASA Grade Assessment: II - A patient with mild                         systemic disease. After reviewing the risks and                         benefits, the patient was deemed in satisfactory                         condition to undergo the procedure. The anesthesia                         plan was to use general anesthesia. Immediately prior                         to administration of medications, the patient was                         re-assessed for adequacy to receive sedatives. The                         heart rate, respiratory rate, oxygen saturations,                         blood pressure, adequacy of pulmonary ventilation, and                         response to care were monitored throughout the                         procedure. The physical status of the patient was                         re-assessed after the procedure.                        After obtaining informed consent, the endoscope was                         passed under direct vision. Throughout the procedure,                         the patient's blood pressure, pulse, and oxygen                         saturations were monitored continuously. The Endoscope                         was introduced through the mouth, and advanced to the  second part of duodenum. The upper GI endoscopy was                         accomplished without difficulty. The patient tolerated                         the procedure well. Findings:      The duodenal bulb and second portion of the duodenum were normal.      A single 3 mm angioectasia with no bleeding was found on the greater       curvature of the gastric body. Coagulation for hemostasis using argon       plasma at 0.8 liters/minute and 20 watts was successful. Estimated blood       loss: none.      The cardia and gastric fundus were normal on retroflexion.      Esophagogastric  landmarks were identified: the gastroesophageal junction       was found at 38 cm from the incisors.      The gastroesophageal junction and examined esophagus were normal. Impression:            - Normal duodenal bulb and second portion of the                         duodenum.                        - A single non-bleeding angioectasia in the stomach.                         Treated with argon plasma coagulation (APC).                        - Esophagogastric landmarks identified.                        - Normal gastroesophageal junction and esophagus.                        - No specimens collected. Recommendation:        - Discharge patient to home (with escort).                        - Resume previous diet today.                        - Continue present medications.                        - Use Prilosec (omeprazole) 40 mg PO daily for 3                         months. Procedure Code(s):     --- Professional ---                        [phone], Esophagogastroduodenoscopy, flexible,                         transoral; with control of bleeding, any method Diagnosis Code(s):     --- Professional ---  K31.819, Angiodysplasia of stomach and duodenum                         without bleeding                        R13.14, Dysphagia, pharyngoesophageal phase CPT copyright 2022 American Medical Association. All rights reserved. The codes documented in this report are preliminary and upon coder review may  be revised to meet current compliance requirements. Dr. Libby Maw Toney Reil MD, MD 12/15/2022 11:30:46 AM This report has been signed electronically. Number of Addenda: 0 Note Initiated On: 12/15/2022 11:16 AM Estimated Blood Loss:  Estimated blood loss: none.      [facility]

## 2022-12-15 NOTE — H&P (Signed)
Arlyss Repress, MD [address]  [address]  [place], [place] [address]  Main: [phone]  Fax: [phone] Pager: [phone]  Primary Care Physician:  Mechele Claude, MD Primary Gastroenterologist:  Dr. Arlyss Repress  Pre-Procedure History & Physical: HPI:  Brittany Browning is a 82 y.o. female is here for an endoscopy.   Past Medical History:  Diagnosis Date   Anemia    Anxiety    Arthritis    osteoarthritis   Basal cell carcinoma (BCC) 02/25/2016   Right spinal mid upper back. Superficial. Excised 04/14/2016, margins free.   Cataract    Complication of anesthesia    has had problems in the past. 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   R med calf, EXC 06/17/2021   Osteopenia    Restless leg syndrome    Stress incontinence    Willis-Ekbom syndrome     Past Surgical History:  Procedure Laterality Date   CATARACT EXTRACTION W/PHACO Left 10/20/2017   Procedure: CATARACT EXTRACTION PHACO AND INTRAOCULAR LENS PLACEMENT (IOC);  Surgeon: Galen Manila, MD;  Location: [facility] ORS;  Service: Ophthalmology;  Laterality: Left;  [name] 00:39.7 AP% 12.9 CDE 5.09 Fluid Pack Lot # [id number]   CATARACT EXTRACTION W/PHACO Right 02/19/2021   Procedure: CATARACT EXTRACTION PHACO AND INTRAOCULAR LENS PLACEMENT (IOC) RIGHT;  Surgeon: Galen Manila, MD;  Location: [facility];  Service: Ophthalmology;  Laterality: Right;  6.15 0:38.6   COLONOSCOPY     EYE SURGERY     SKIN CANCER EXCISION     TONSILLECTOMY     TONSILLECTOMY AND ADENOIDECTOMY  1944    Prior to Admission medications   Medication Sig Start Date End Date Taking? Authorizing Provider  Azelastine HCl 0.15 % SOLN Place 205.5 application into the nose. Place 2 sprays into both nostrils nightly 02/06/16   [provider]  CALCIUM PO Take 1,400 mg by mouth daily.    [provider]  cevimeline (EVOXAC) 30 MG capsule Take 30 mg by  mouth 3 (three) times daily.    [provider]  Cholecalciferol (VITAMIN D-3 PO) Take by mouth.    [provider]  Cinnamon 500 MG capsule Take 2 capsules by mouth daily.     [provider]  dipyridamole (PERSANTINE) 50 MG tablet Take by mouth. 07/24/22   [provider]  Docusate Calcium (STOOL SOFTENER PO) Stool Softener    [provider]  Estradiol 10 MCG TABS vaginal tablet Place 10 mcg vaginally.    [provider]  fluocinonide cream (LIDEX) 0.05 %     [provider]  furosemide (LASIX) 20 MG tablet Take 20 mg by mouth daily.    [provider]  Glucosamine-Chondroitin (COSAMIN DS PO) Take by mouth.    [provider]  Hypertonic Nasal Wash (SINUS RINSE NA) Place 1 application into the nose daily.    [provider]  ketotifen (ZADITOR) 0.025 % ophthalmic solution Apply 1 drop to eye 2 (two) times daily as needed.    [provider]  levocetirizine (XYZAL) 5 MG tablet Take 5 mg by mouth every evening.    [provider]  MAGNESIUM GLUCONATE PO Take 140 mg by mouth daily.    [provider]  montelukast (SINGULAIR) 10 MG tablet Take 1 tablet by mouth daily. 10/14/10   [provider]  Multiple Vitamins-Minerals (PRESERVISION AREDS PO) Take by mouth daily.    [provider]  MYRBETRIQ 50 MG TB24 tablet Take 50 mg by mouth daily. 11/03/22   [provider]  NONFORMULARY OR COMPOUNDED ITEM Cream with cannabis oil    [provider]  omeprazole (PRILOSEC) 40 MG capsule Take 0.5 tablets by mouth daily. 07/25/22   [provider]  ondansetron (ZOFRAN-ODT) 4 MG disintegrating tablet Take 4 mg by mouth every 8 (eight) hours as needed. 09/22/22   [provider]  OXYGEN Inhale 2 L into the lungs at bedtime.    [provider]  pramipexole (MIRAPEX) 0.125 MG tablet Take by mouth. 07/24/22   [provider]   Probiotic Product (PROBIOTIC PO) Take by mouth daily.    [provider]  Rotigotine 1 MG/24HR PT24 Place onto the skin.    [provider]    Allergies as of 12/02/2022 - Review Complete 12/02/2022  Allergen Reaction Noted   Bupropion Anxiety and Other (See Comments) 06/03/2017   Lactose intolerance (gi) Other (See Comments) 10/06/2017   Sulfa antibiotics Other (See Comments) 12/31/2016   Zolpidem Other (See Comments) 10/06/2017    Family History  Problem Relation Age of Onset   Varicose Veins Mother    Stroke Maternal Grandfather    Breast cancer Neg Hx     Social History   Socioeconomic History   Marital status: Widowed    Spouse name: Not on file   Number of children: Not on file   Years of education: Not on file   Highest education level: Not on file  Occupational History   Not on file  Tobacco Use   Smoking status: Never   Smokeless tobacco: Never  Vaping Use   Vaping Use: Never used  Substance and Sexual Activity   Alcohol use: No   Drug use: No   Sexual activity: Not on file  Other Topics Concern   Not on file  Social History Narrative   Widowed in 2021.   Resides in [place].   Retired.   Social Determinants of Health   Financial Resource Strain: Not on file  Food Insecurity: Not on file  Transportation Needs: Not on file  Physical Activity: Not on file  Stress: Not on file  Social Connections: Not on file  Intimate Partner Violence: Not on file    Review of Systems: See HPI, otherwise negative ROS  Physical Exam: BP 120/85   Pulse 77   Temp 97.8 F (36.6 C) (Temporal)   Resp 18   Ht 5\' 6"  (1.676 m)   Wt 138 lb 4.4 oz (62.7 kg)   SpO2 97%   BMI 22.32 kg/m  General:   Alert,  pleasant and cooperative in NAD Head:  Normocephalic and atraumatic. Neck:  Supple; no masses or thyromegaly. Lungs:  Clear throughout to auscultation.    Heart:  Regular rate and rhythm. Abdomen:  Soft, nontender and nondistended. Normal  bowel sounds, without guarding, and without rebound.   Neurologic:  Alert and  oriented x4;  grossly normal neurologically.  Impression/Plan: Brittany Browning is here for an endoscopy to be performed for dysphagia  Risks, benefits, limitations, and alternatives regarding  endoscopy have been reviewed with the patient.  Questions have been answered.  All parties agreeable.   Lannette Donath, MD  12/15/2022, 11:08 AM

## 2022-12-16 ENCOUNTER — Encounter: Payer: Self-pay | Admitting: Gastroenterology

## 2023-03-17 ENCOUNTER — Ambulatory Visit (INDEPENDENT_AMBULATORY_CARE_PROVIDER_SITE_OTHER): Payer: Medicare Other | Admitting: Dermatology

## 2023-03-17 VITALS — BP 112/65

## 2023-03-17 DIAGNOSIS — W908XXA Exposure to other nonionizing radiation, initial encounter: Secondary | ICD-10-CM | POA: Diagnosis not present

## 2023-03-17 DIAGNOSIS — Z86006 Personal history of melanoma in-situ: Secondary | ICD-10-CM

## 2023-03-17 DIAGNOSIS — Z1283 Encounter for screening for malignant neoplasm of skin: Secondary | ICD-10-CM | POA: Diagnosis not present

## 2023-03-17 DIAGNOSIS — L578 Other skin changes due to chronic exposure to nonionizing radiation: Secondary | ICD-10-CM

## 2023-03-17 DIAGNOSIS — D229 Melanocytic nevi, unspecified: Secondary | ICD-10-CM

## 2023-03-17 DIAGNOSIS — D2272 Melanocytic nevi of left lower limb, including hip: Secondary | ICD-10-CM

## 2023-03-17 DIAGNOSIS — Z85828 Personal history of other malignant neoplasm of skin: Secondary | ICD-10-CM

## 2023-03-17 DIAGNOSIS — I781 Nevus, non-neoplastic: Secondary | ICD-10-CM

## 2023-03-17 DIAGNOSIS — L814 Other melanin hyperpigmentation: Secondary | ICD-10-CM

## 2023-03-17 DIAGNOSIS — D2271 Melanocytic nevi of right lower limb, including hip: Secondary | ICD-10-CM

## 2023-03-17 DIAGNOSIS — D692 Other nonthrombocytopenic purpura: Secondary | ICD-10-CM

## 2023-03-17 DIAGNOSIS — I8393 Asymptomatic varicose veins of bilateral lower extremities: Secondary | ICD-10-CM

## 2023-03-17 DIAGNOSIS — S0081XA Abrasion of other part of head, initial encounter: Secondary | ICD-10-CM

## 2023-03-17 DIAGNOSIS — L821 Other seborrheic keratosis: Secondary | ICD-10-CM

## 2023-03-17 NOTE — Progress Notes (Signed)
Follow-Up Visit   Subjective  Brittany Browning is a 82 y.o. female who presents for the following: Skin Cancer Screening and Full Body Skin Exam, hx of Melanoma IS, hx of BCC  The patient presents for Total-Body Skin Exam (TBSE) for skin cancer screening and mole check. The patient has spots, moles and lesions to be evaluated, some may be new or changing and the patient may have concern these could be cancer.    The following portions of the chart were reviewed this encounter and updated as appropriate: medications, allergies, medical history  Review of Systems:  No other skin or systemic complaints except as noted in HPI or Assessment and Plan.  Objective  Well appearing patient in no apparent distress; mood and affect are within normal limits.  A full examination was performed including scalp, head, eyes, ears, nose, lips, neck, chest, axillae, abdomen, back, buttocks, bilateral upper extremities, bilateral lower extremities, hands, feet, fingers, toes, fingernails, and toenails. All findings within normal limits unless otherwise noted below.   Relevant physical exam findings are noted in the Assessment and Plan.  L upper clavicle   Assessment & Plan   SKIN CANCER SCREENING PERFORMED TODAY.  ACTINIC DAMAGE - Chronic condition, secondary to cumulative UV/sun exposure - diffuse scaly erythematous macules with underlying dyspigmentation - Recommend daily broad spectrum sunscreen SPF 30+ to sun-exposed areas, reapply every 2 hours as needed.  - Staying in the shade or wearing long sleeves, sun glasses (UVA+UVB protection) and wide brim hats (4-inch brim around the entire circumference of the hat) are also recommended for sun protection.  - Call for new or changing lesions.  LENTIGINES, SEBORRHEIC KERATOSES, HEMANGIOMAS - Benign normal skin lesions - Benign-appearing - Call for any changes  LENTIGO VS SK L upper clavicle Exam: L upper clavicle 10.0 x 6.23mm speckled tan  macule  Treatment Plan: Benign-appearing.  Observation.  Call clinic for new or changing lesions.  Recommend daily use of broad spectrum spf 30+ sunscreen to sun-exposed areas  MELANOCYTIC NEVI - Tan-brown and/or pink-flesh-colored symmetric macules and papules - Benign appearing on exam today - Observation - Call clinic for new or changing moles - Recommend daily use of broad spectrum spf 30+ sunscreen to sun-exposed areas.  - R post hip 5.64mm speckled tan macule - R lat thigh 3.9mm medium dark brown macule - L foot dorsum 2.15mm light gray brown macule  HISTORY OF MELANOMA IN SITU - No evidence of recurrence today - Recommend regular full body skin exams - Recommend daily broad spectrum sunscreen SPF 30+ to sun-exposed areas, reapply every 2 hours as needed.  - Call if any new or changing lesions are noted between office visits  - R med calf exc 06/17/2021  HISTORY OF BASAL CELL CARCINOMA OF THE SKIN - No evidence of recurrence today - Recommend regular full body skin exams - Recommend daily broad spectrum sunscreen SPF 30+ to sun-exposed areas, reapply every 2 hours as needed.  - Call if any new or changing lesions are noted between office visits  - R spinal mid upper back  Varicose Veins/Spider Veins legs - Dilated blue, purple or red veins at the lower extremities - Reassured - Smaller vessels can be treated by sclerotherapy (a procedure to inject a medicine into the veins to make them disappear) if desired, but the treatment is not covered by insurance. Larger vessels may be covered if symptomatic and we would refer to vascular surgeon if treatment desired.   EXCORIATIONS Exam: healing excoriations glabella  Treatment Plan: Benign-appearing.  Observation.  Call clinic for new or changing lesions.  Recommend daily use of broad spectrum spf 30+ sunscreen to sun-exposed areas.    Purpura - Chronic; persistent and recurrent.  Treatable, but not curable. - Violaceous macules  and patches - Benign - Related to trauma, age, sun damage and/or use of blood thinners, chronic use of topical and/or oral steroids - Observe - Can use OTC arnica containing moisturizer such as Dermend Bruise Formula if desired - Call for worsening or other concerns - R hand  Return in about 1 year (around 03/16/2024) for TBSE, Hx of Melanoma, Hx of BCC.  I, Ardis Rowan, RMA, am acting as scribe for Willeen Niece, MD .   Documentation: I have reviewed the above documentation for accuracy and completeness, and I agree with the above.  Willeen Niece, MD

## 2023-03-17 NOTE — Patient Instructions (Addendum)
Recommend Serica moisturizing scar formula cream every night or [facility] brand or Mederma silicone scar sheet every night for the first year after a scar appears to help with scar remodeling if desired. Scars remodel on their own for a full year and will gradually improve in appearance over time.    Basic OTC daily skin care regimen to prevent photoaging:   Recommend facial moisturizer with sunscreen SPF 30 every morning (OTC brands include CeraVe AM, Neutrogena, Eucerin, Cetaphil, Aveeno, La Roche Posay).  Can also apply a topical Vit C serum which is an antioxidant (OTC brands include CeraVe, La Roche Posay, and The Ordinary) underneath sunscreen in morning. If you are outside during the day in the summer for extended periods, especially swimming and/or sweating, make sure you apply a water resistant facial sunscreen lotion spf 30 or higher.   At night recommend a cream with retinol (a vitamin A derivative which stimulates collagen production) like CeraVe skin renewing retinol serum or ROC retinol correxion cream or Neutrogena rapid wrinkle repair cream. Retinol may cause skin irritation in people with sensitive skin.  Can use it every other day and/or apply on top of a hyaluronic acid (HA) moisturizer/serum (Neutrogena Hydroboost water cream) if better tolerated that way.  Retinol may also help with lightening brown spots.   Our office sells high quality, medically tested skin care lines such as Elta MD sunscreens (with Zinc), and Alastin skin care products, which are very effective in treating photoaging. The Alastin line includes cosmeceutical grade Vit.C serum, HA serum, Elastin stimulating moisturizers/serums, lightening serum, and sunscreens.  If you want prescription treatment, then you would need an appointment (Rx tretinoin and fade creams, Botox, filler injections, laser treatments, etc.) These prescriptions and procedures are not covered by insurance but work very well.   Due to recent  changes in healthcare laws, you may see results of your pathology and/or laboratory studies on MyChart before the doctors have had a chance to review them. We understand that in some cases there may be results that are confusing or concerning to you. Please understand that not all results are received at the same time and often the doctors may need to interpret multiple results in order to provide you with the best plan of care or course of treatment. Therefore, we ask that you please give [name] 2 business days to thoroughly review all your results before contacting the office for clarification. Should we see a critical lab result, you will be contacted sooner.   If You Need Anything After Your Visit  If you have any questions or concerns for your doctor, please call our main line at [phone] and press option 4 to reach your doctor's medical assistant. If no one answers, please leave a voicemail as directed and we will return your call as soon as possible. Messages left after 4 pm will be answered the following business day.   You may also send [name] a message via MyChart. We typically respond to MyChart messages within 1-2 business days.  For prescription refills, please ask your pharmacy to contact our office. Our fax number is [phone].  If you have an urgent issue when the clinic is closed that cannot wait until the next business day, you can page your doctor at the number below.    Please note that while we do our best to be available for urgent issues outside of office hours, we are not available 24/7.   If you have an urgent issue and are unable  to reach [name], you may choose to seek medical care at your doctor's office, retail clinic, urgent care center, or emergency room.  If you have a medical emergency, please immediately call 911 or go to the emergency department.  Pager Numbers  - Dr. Gwen Pounds: [phone]  - Dr. Roseanne Reno: [phone]  - Dr. Katrinka Blazing: [phone]   In the event  of inclement weather, please call our main line at [phone] for an update on the status of any delays or closures.  Dermatology Medication Tips: Please keep the boxes that topical medications come in in order to help keep track of the instructions about where and how to use these. Pharmacies typically print the medication instructions only on the boxes and not directly on the medication tubes.   If your medication is too expensive, please contact our office at [phone] option 4 or send [name] a message through MyChart.   We are unable to tell what your co-pay for medications will be in advance as this is different depending on your insurance coverage. However, we may be able to find a substitute medication at lower cost or fill out paperwork to get insurance to cover a needed medication.   If a prior authorization is required to get your medication covered by your insurance company, please allow [name] 1-2 business days to complete this process.  Drug prices often vary depending on where the prescription is filled and some pharmacies may offer cheaper prices.  The website www.goodrx.com contains coupons for medications through different pharmacies. The prices here do not account for what the cost may be with help from insurance (it may be cheaper with your insurance), but the website can give you the price if you did not use any insurance.  - You can print the associated coupon and take it with your prescription to the pharmacy.  - You may also stop by our office during regular business hours and pick up a GoodRx coupon card.  - If you need your prescription sent electronically to a different pharmacy, notify our office through [facility] or by phone at [phone] option 4.     Si Usted Necesita Algo Despus de Su Visita  Tambin puede enviarnos un mensaje a travs de Clinical cytogeneticist. Por lo general respondemos a los mensajes de MyChart en el transcurso de 1 a 2 das hbiles.  Para  renovar recetas, por favor pida a su farmacia que se ponga en contacto con nuestra oficina. Annie Sable de fax es [place] [phone].  Si tiene un asunto urgente cuando la clnica est cerrada y que no puede esperar hasta el siguiente da hbil, puede llamar/localizar a su doctor(a) al nmero que aparece a continuacin.   Por favor, tenga en cuenta que aunque hacemos todo lo posible para estar disponibles para asuntos urgentes fuera del horario de [place], no estamos disponibles las 24 horas del da, los 7 [address]  [address] de la [place].   Si tiene un problema urgente y no puede comunicarse con nosotros, puede optar por buscar atencin mdica  en el consultorio de su doctor(a), en una clnica privada, en un centro de atencin urgente o en una sala de emergencias.  Si tiene Engineer, drilling, por favor llame inmediatamente al 911 o vaya a la sala de emergencias.  Nmeros de bper  - Dr. Gwen Pounds: [phone]  - Dra. Roseanne Reno: [phone]  - Dr. Katrinka Blazing: [phone]   En caso de inclemencias del tiempo, por favor llame a Lacy Duverney principal al [phone] para Neomia Dear actualizacin sobre el  estado de cualquier retraso o cierre.  Consejos para la medicacin en dermatologa: Por favor, guarde las cajas en las que vienen los medicamentos de uso tpico para ayudarle a seguir las instrucciones sobre dnde y cmo usarlos. Las farmacias generalmente imprimen las instrucciones del medicamento slo en las cajas y no directamente en los tubos del [place].   Si su medicamento es muy caro, por favor, pngase en contacto con Rolm Gala llamando al [phone] y presione la opcin 4 o envenos un mensaje a travs de Clinical cytogeneticist.   No podemos decirle cul ser su copago por los medicamentos por adelantado ya que esto es diferente dependiendo de la cobertura de su seguro. Sin embargo, es posible que podamos encontrar un medicamento sustituto a Audiological scientist un formulario para que el seguro cubra el  medicamento que se considera necesario.   Si se requiere una autorizacin previa para que su compaa de seguros [place] su medicamento, por favor permtanos de 1 a 2 das hbiles para completar [address]  Los precios de los medicamentos varan con frecuencia dependiendo del Environmental consultant de dnde se surte la receta y alguna farmacias pueden ofrecer precios ms baratos.  El sitio web www.goodrx.com tiene cupones para medicamentos de Health and safety inspector. Los precios aqu no tienen en cuenta lo que podra costar con la ayuda del seguro (puede ser ms barato con su seguro), pero el sitio web puede darle el precio si no utiliz Tourist information centre manager.  - Puede imprimir el cupn correspondiente y llevarlo con su receta a la farmacia.  - Tambin puede pasar por nuestra oficina durante el horario de atencin regular y Education officer, museum una tarjeta de cupones de GoodRx.  - Si necesita que su receta se enve electrnicamente a una farmacia diferente, informe a nuestra oficina a travs de MyChart de [place] o por telfono llamando al [phone] y presione la opcin 4.

## 2023-05-11 ENCOUNTER — Telehealth: Payer: Self-pay

## 2023-05-11 NOTE — Telephone Encounter (Signed)
Patient came by office to bring pathology report of biopsies done at [facility] 10/06/2022. Report shows 1) Left proximal mid dorsal foot - Lentiginous Compound Nevus and 2) Right inferior mid thigh - Solar Lentigo.

## 2023-11-02 ENCOUNTER — Ambulatory Visit (INDEPENDENT_AMBULATORY_CARE_PROVIDER_SITE_OTHER): Admitting: Dermatology

## 2023-11-02 DIAGNOSIS — I8393 Asymptomatic varicose veins of bilateral lower extremities: Secondary | ICD-10-CM | POA: Diagnosis not present

## 2023-11-02 DIAGNOSIS — Z86006 Personal history of melanoma in-situ: Secondary | ICD-10-CM | POA: Diagnosis not present

## 2023-11-02 DIAGNOSIS — D229 Melanocytic nevi, unspecified: Secondary | ICD-10-CM

## 2023-11-02 DIAGNOSIS — D225 Melanocytic nevi of trunk: Secondary | ICD-10-CM | POA: Diagnosis not present

## 2023-11-02 NOTE — Progress Notes (Signed)
   Follow-Up Visit   Subjective  Brittany Browning is a 83 y.o. female who presents for the following: Spots to check on the left inferior breast and right thigh.   The patient has spots, moles and lesions to be evaluated, some may be new or changing and the patient may have concern these could be cancer.   The following portions of the chart were reviewed this encounter and updated as appropriate: medications, allergies, medical history  Review of Systems:  No other skin or systemic complaints except as noted in HPI or Assessment and Plan.  Objective  Well appearing patient in no apparent distress; mood and affect are within normal limits.  A focused examination was performed of the following areas: Face, legs, trunk  Relevant physical exam findings are noted in the Assessment and Plan.   Right lateral thigh   Left lateral breast   Assessment & Plan    MELANOCYTIC NEVI Exam:  3.0 mm medium dark brown macule with lighter edge, stable compared to photo 08/13/2021. New photo taken today.   8.0 X 4.0 mm thin brown papule at left lateral breast, photo taken today   Treatment Plan: Benign appearing on exam today. Recommend observation. Call clinic for new or changing moles. Recommend daily use of broad spectrum spf 30+ sunscreen to sun-exposed areas.   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  - Call if any new or changing lesions are noted between office visits   Varicose Veins/Spider Veins - Dilated blue, purple or red veins at the lower extremities - Reassured - Smaller vessels can be treated by sclerotherapy (a procedure to inject a medicine into the veins to make them disappear) if desired, but the treatment is not covered by insurance. Larger vessels may be covered if symptomatic and we would  refer to vascular surgeon if treatment desired.   Return as scheduled, for TBSE.  ICherlyn Labella, CMA, am acting as scribe for Willeen Niece, MD .   Documentation: I have reviewed the above documentation for accuracy and completeness, and I agree with the above.  Willeen Niece, MD

## 2023-11-02 NOTE — Patient Instructions (Addendum)

## 2024-03-14 ENCOUNTER — Ambulatory Visit: Payer: Medicare Other | Admitting: Dermatology

## 2024-03-14 DIAGNOSIS — D2271 Melanocytic nevi of right lower limb, including hip: Secondary | ICD-10-CM

## 2024-03-14 DIAGNOSIS — L578 Other skin changes due to chronic exposure to nonionizing radiation: Secondary | ICD-10-CM

## 2024-03-14 DIAGNOSIS — L821 Other seborrheic keratosis: Secondary | ICD-10-CM

## 2024-03-14 DIAGNOSIS — D225 Melanocytic nevi of trunk: Secondary | ICD-10-CM

## 2024-03-14 DIAGNOSIS — I8393 Asymptomatic varicose veins of bilateral lower extremities: Secondary | ICD-10-CM

## 2024-03-14 DIAGNOSIS — D2362 Other benign neoplasm of skin of left upper limb, including shoulder: Secondary | ICD-10-CM

## 2024-03-14 DIAGNOSIS — Z85828 Personal history of other malignant neoplasm of skin: Secondary | ICD-10-CM

## 2024-03-14 DIAGNOSIS — Z1283 Encounter for screening for malignant neoplasm of skin: Secondary | ICD-10-CM

## 2024-03-14 DIAGNOSIS — L853 Xerosis cutis: Secondary | ICD-10-CM

## 2024-03-14 DIAGNOSIS — W908XXA Exposure to other nonionizing radiation, initial encounter: Secondary | ICD-10-CM

## 2024-03-14 DIAGNOSIS — R202 Paresthesia of skin: Secondary | ICD-10-CM

## 2024-03-14 DIAGNOSIS — L814 Other melanin hyperpigmentation: Secondary | ICD-10-CM | POA: Diagnosis not present

## 2024-03-14 DIAGNOSIS — D229 Melanocytic nevi, unspecified: Secondary | ICD-10-CM

## 2024-03-14 DIAGNOSIS — D239 Other benign neoplasm of skin, unspecified: Secondary | ICD-10-CM

## 2024-03-14 DIAGNOSIS — Z86006 Personal history of melanoma in-situ: Secondary | ICD-10-CM

## 2024-03-14 NOTE — Patient Instructions (Addendum)
 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 Chronic rubbing or scratching causes darkening of the skin.  OTC treatments which can help with itch include numbing creams like pramoxine or lidocaine  which temporarily reduce itch or Capsaicin-containing creams which cause a burning sensation but which sometimes over time will reset the nerves to stop producing itch.  If you choose to use Capsaicin cream, it is recommended to use it 5 times daily for 1 week followed by 3 times daily for 3-6 weeks. You may have to continue using it long-term.    Gentle Skin Care Guide  1. Bathe no more than once a day.  2. Avoid bathing in hot water  3. Use a mild soap like Dove, Vanicream, Cetaphil, CeraVe. Can use Lever 2000 or Cetaphil antibacterial soap  4. Use soap only where you need it. On most days, use it under your arms, between your legs, and on your feet. Let the water rinse other areas unless visibly dirty.  5. When you get out of the bath/shower, use a towel to gently blot your skin dry, don't rub it.  6. While your skin is still a little damp, apply a moisturizing cream such as Vanicream, CeraVe, Cetaphil, Eucerin, Sarna lotion or plain Vaseline Jelly. For hands apply Neutrogena [place] Hand Cream or Excipial Hand Cream.  7. Reapply moisturizer any time you start to itch or feel dry.  8. Sometimes using free and clear laundry detergents can be helpful. Fabric softener sheets should be avoided. Downy Free & Gentle liquid, or any liquid fabric softener that is free of dyes and perfumes, it acceptable to use  9. If your doctor has given you prescription creams you may apply moisturizers over them       Melanoma ABCDEs  Melanoma is the most dangerous type of skin cancer, and is the leading cause of death from skin disease.  You are more likely to develop melanoma if you: Have light-colored  skin, light-colored eyes, or red or blond hair Spend a lot of time in the sun Tan regularly, either outdoors or in a tanning bed Have had blistering sunburns, especially during childhood Have a close family member who has had a melanoma Have atypical moles or large birthmarks  Early detection of melanoma is key since treatment is typically straightforward and cure rates are extremely high if we catch it early.   The first sign of melanoma is often a change in a mole or a new dark spot.  The ABCDE system is a way of remembering the signs of melanoma.  A for asymmetry:  The two halves do not match. B for border:  The edges of the growth are irregular. C for color:  A mixture of colors are present instead of an even brown color. D for diameter:  Melanomas are usually (but not always) greater than 6mm - the size of a pencil eraser. E for evolution:  The spot keeps changing in size, shape, and color.  Please check your skin once per month between visits. You can use a small mirror in front and a large mirror behind you to keep an eye on the back side or your body.   If you see any new or changing lesions before your next follow-up, please call to schedule a visit.  Please continue daily skin protection including broad spectrum sunscreen SPF 30+ to sun-exposed areas, reapplying every 2 hours as needed when  you're outdoors.   Staying in the shade or wearing long sleeves, sun glasses (UVA+UVB protection) and wide brim hats (4-inch brim around the entire circumference of the hat) are also recommended for sun protection.    Due to recent changes in healthcare laws, you may see results of your pathology and/or laboratory studies on MyChart before the doctors have had a chance to review them. We understand that in some cases there may be results that are confusing or concerning to you. Please understand that not all results are received at the same time and often the doctors may need to interpret  multiple results in order to provide you with the best plan of care or course of treatment. Therefore, we ask that you please give us  2 business days to thoroughly review all your results before contacting the office for clarification. Should we see a critical lab result, you will be contacted sooner.   If You Need Anything After Your Visit  If you have any questions or concerns for your doctor, please call our main line at [phone] and press option 4 to reach your doctor's medical assistant. If no one answers, please leave a voicemail as directed and we will return your call as soon as possible. Messages left after 4 pm will be answered the following business day.   You may also send us  a message via MyChart. We typically respond to MyChart messages within 1-2 business days.  For prescription refills, please ask your pharmacy to contact our office. Our fax number is [phone].  If you have an urgent issue when the clinic is closed that cannot wait until the next business day, you can page your doctor at the number below.    Please note that while we do our best to be available for urgent issues outside of office hours, we are not available 24/7.   If you have an urgent issue and are unable to reach us , you may choose to seek medical care at your doctor's office, retail clinic, urgent care center, or emergency room.  If you have a medical emergency, please immediately call 911 or go to the emergency department.  Pager Numbers  - Dr. Hester: [phone]  - Dr. Jackquline: [phone]  - Dr. Claudene: [phone]   In the event of inclement weather, please call our main line at [phone] for an update on the status of any delays or closures.  Dermatology Medication Tips: Please keep the boxes that topical medications come in in order to help keep track of the instructions about where and how to use these. Pharmacies typically print the medication instructions only on the boxes  and not directly on the medication tubes.   If your medication is too expensive, please contact our office at [phone] option 4 or send us  a message through MyChart.   We are unable to tell what your co-pay for medications will be in advance as this is different depending on your insurance coverage. However, we may be able to find a substitute medication at lower cost or fill out paperwork to get insurance to cover a needed medication.   If a prior authorization is required to get your medication covered by your insurance company, please allow us  1-2 business days to complete this process.  Drug prices often vary depending on where the prescription is filled and some pharmacies may offer cheaper prices.  The website www.goodrx.com contains coupons for medications through different pharmacies. The prices here do not account for what the cost  may be with help from insurance (it may be cheaper with your insurance), but the website can give you the price if you did not use any insurance.  - You can print the associated coupon and take it with your prescription to the pharmacy.  - You may also stop by our office during regular business hours and pick up a GoodRx coupon card.  - If you need your prescription sent electronically to a different pharmacy, notify our office through [facility] or by phone at [phone] option 4.     Si Usted Necesita Algo Despus de Su Visita  Tambin puede enviarnos un mensaje a travs de Clinical cytogeneticist. Por lo general respondemos a los mensajes de MyChart en el transcurso de 1 a 2 das hbiles.  Para renovar recetas, por favor pida a su farmacia que se ponga en contacto con nuestra oficina. Randi lakes de fax es [place] [phone].  Si tiene un asunto urgente cuando la clnica est cerrada y que no puede esperar hasta el siguiente da hbil, puede llamar/localizar a su doctor(a) al nmero que aparece a continuacin.   Por favor, tenga en cuenta que aunque  hacemos todo lo posible para estar disponibles para asuntos urgentes fuera del horario de [place], no estamos disponibles las 24 horas del da, los 7 [address]  [address] de la [place].   Si tiene un problema urgente y no puede comunicarse con nosotros, puede optar por buscar atencin mdica  en el consultorio de su doctor(a), en una clnica privada, en un centro de atencin urgente o en una sala de emergencias.  Si tiene Engineer, drilling, por favor llame inmediatamente al 911 o vaya a la sala de emergencias.  Nmeros de bper  - Dr. Hester: [phone]  - Dra. Jackquline: [phone]  - Dr. Claudene: [phone]   En caso de inclemencias del tiempo, por favor llame a landry capes principal al [phone] para una actualizacin sobre el [place] de cualquier retraso o cierre.  Consejos para la medicacin en dermatologa: Por favor, guarde las cajas en las que vienen los medicamentos de uso tpico para ayudarle a seguir las instrucciones sobre dnde y cmo usarlos. Las farmacias generalmente imprimen las instrucciones del medicamento slo en las cajas y no directamente en los tubos del [place].   Si su medicamento es muy caro, por favor, pngase en contacto con landry rieger llamando al [phone] y presione la opcin 4 o envenos un mensaje a travs de Clinical cytogeneticist.   No podemos decirle cul ser su copago por los medicamentos por adelantado ya que esto es diferente dependiendo de la cobertura de su seguro. Sin embargo, es posible que podamos encontrar un medicamento sustituto a Audiological scientist un formulario para que el seguro cubra el medicamento que se considera necesario.   Si se requiere una autorizacin previa para que su compaa de seguros [place] su medicamento, por favor permtanos de 1 a 2 das hbiles para completar este proceso.  Los precios de los medicamentos varan con frecuencia dependiendo del Environmental consultant de dnde se surte la receta y alguna farmacias pueden ofrecer precios ms  baratos.  El sitio web www.goodrx.com tiene cupones para medicamentos de Health and safety inspector. Los precios aqu no tienen en cuenta lo que podra costar con la ayuda del seguro (puede ser ms barato con su seguro), pero el sitio web puede darle el precio si no utiliz Tourist information centre manager.  - Puede imprimir el cupn correspondiente y llevarlo con su receta a la farmacia.  - Tambin puede pasar por  nuestra oficina durante el horario de atencin regular y recoger una tarjeta de cupones de GoodRx.  - Si necesita que su receta se enve electrnicamente a una farmacia diferente, informe a nuestra oficina a travs de MyChart de [place] o por telfono llamando al [phone] y presione la opcin 4.

## 2024-03-14 NOTE — Progress Notes (Addendum)
 Follow-Up Visit   Subjective  Brittany Browning is a 83 y.o. female who presents for the following: Skin Cancer Screening and Full Body Skin Exam Hx mmis, hx of bcc, pt has severe restless leg syndrome  The patient presents for Total-Body Skin Exam (TBSE) for skin cancer screening and mole check. The patient has spots, moles and lesions to be evaluated, some may be new or changing and the patient may have concern these could be cancer.    The following portions of the chart were reviewed this encounter and updated as appropriate: medications, allergies, medical history  Review of Systems:  No other skin or systemic complaints except as noted in HPI or Assessment and Plan.  Objective  Well appearing patient in no apparent distress; mood and affect are within normal limits.  A full examination was performed including scalp, head, eyes, ears, nose, lips, neck, chest, axillae, abdomen, back, buttocks, bilateral upper extremities, bilateral lower extremities, hands, feet, fingers, toes, fingernails, and toenails. All findings within normal limits unless otherwise noted below.   Relevant physical exam findings are noted in the Assessment and Plan.    Assessment & Plan   SKIN CANCER SCREENING PERFORMED TODAY.  ACTINIC DAMAGE - Chronic condition, secondary to cumulative UV/sun exposure - diffuse scaly erythematous macules with underlying dyspigmentation - Recommend daily broad spectrum sunscreen SPF 30+ to sun-exposed areas, reapply every 2 hours as needed.  - Staying in the shade or wearing long sleeves, sun glasses (UVA+UVB protection) and wide brim hats (4-inch brim around the entire circumference of the hat) are also recommended for sun protection.  - Call for new or changing lesions.  LENTIGINES, SEBORRHEIC KERATOSES, HEMANGIOMAS - Benign normal skin lesions - Benign-appearing - Call for any changes  LENTIGO VS SK L upper clavicle Exam: L upper clavicle 10.0 x 6.28mm speckled tan  macule, no change when compared to previous photo   Treatment Plan: Benign-appearing. Stable. Observation.  Call clinic for new or changing lesions.  Recommend daily use of broad spectrum spf 30+ sunscreen to sun-exposed areas   MELANOCYTIC NEVI Exam:  - Right lateral thigh 3 mm medium dark brown macule with lighter edge, stable compared to photo 08/13/2021.   - left lateral breast 8 mm x 4 mm thin brown papule - R post hip 5 mm speckled tan macule - Tan-brown and/or pink-flesh-colored symmetric macules and papules - Benign appearing on exam today - Observation - Call clinic for new or changing moles - Recommend daily use of broad spectrum spf 30+ sunscreen to sun-exposed areas.   DERMATOFIBROMA Exam: Firm pink/brown papulenodule with dimple sign at left posterior upper arm  Treatment Plan: A dermatofibroma is a benign growth possibly related to trauma, such as an insect bite, cut from shaving, or inflamed acne-type bump.  Treatment options to remove include shave or excision with resulting scar and risk of recurrence.  Since benign-appearing and not bothersome, will observe for now.    NOTALGIA PARESTHETICA Left spinal mid back Exam: Back clear today where pt c/o itch  Chronic condition without cure secondary to pinched nerve along spine causing itching or sensation changes in an area of skin. Chronic rubbing or scratching causes darkening of the skin.  OTC treatments which can help with itch include numbing creams like pramoxine or lidocaine  which temporarily reduce itch or Capsaicin-containing creams which cause a burning sensation but which sometimes over time will reset the nerves to stop producing itch.  If you choose to use Capsaicin cream, it is recommended  to use it 5 times daily for 1 week followed by 3 times daily for 3-6 weeks. You may have to continue using it long-term.  If not doing well with OTC options, could consider [employer] compounded prescription anti-itch  cream with Amitriptyline 5% / Lidocaine  5% / Pramoxine 1% or Amitriptyline 5% / Gabapentin  10% / Lidocaine  5% Cream or other prescription cream or pill options.   Xerosis - diffuse xerotic patches - recommend gentle, hydrating skin care - gentle skin care handout given - multiple moisturizing cream samples given, Eucerin, Vanicream, Lipikar, Aveeno - recommend Dove BW, sample given  Varicose Veins/Spider Veins - Dilated blue, purple or red veins at the lower extremities - Reassured - Smaller vessels can be treated by sclerotherapy (a procedure to inject a medicine into the veins to make them disappear) if desired, but the treatment is not covered by insurance. Larger vessels may be covered if symptomatic and we would refer to vascular surgeon if treatment desired.   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  - Call if any new or changing lesions are noted between office visits    Return in about 6 months (around 09/14/2024) for TBSE.  I, Eleanor Blush, CMA, am acting as scribe for Rexene Rattler, MD.   Documentation: I have reviewed the above documentation for accuracy and completeness, and I agree with the above.  Rexene Rattler, MD

## 2024-04-15 ENCOUNTER — Encounter: Payer: Self-pay | Admitting: Podiatry

## 2024-04-15 ENCOUNTER — Ambulatory Visit

## 2024-04-15 ENCOUNTER — Ambulatory Visit: Admitting: Podiatry

## 2024-04-15 VITALS — Ht 66.0 in | Wt 138.3 lb

## 2024-04-15 DIAGNOSIS — M19072 Primary osteoarthritis, left ankle and foot: Secondary | ICD-10-CM

## 2024-04-15 NOTE — Progress Notes (Signed)
 Chief Complaint  Patient presents with   Foot Pain    Pt is here due to left foot pain, she states that she hurt her foot while doing yoga last Friday, she was seen at [facility] walk in clinic where x-rays were done, she has the report and disk of x-rays, states she rather not do another one, wants to be fitted for a brace for the foot.     HPI: 83 y.o. female presenting today as a reestablish new patient for evaluation of pain and tenderness to the left foot.  DOI: 04/08/2024.  Presented to the after-hours [facility] where x-rays were taken concerning for possible Lisfranc injury.  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  Over the past week she reports that she is approximately 80% better.  She has minimal pain out of the foot.  Past Medical History:  Diagnosis Date   Anemia    Anxiety    Arthritis    osteoarthritis   Basal cell carcinoma (BCC) 02/25/2016   Right spinal mid upper back. Superficial. Excised 04/14/2016, margins free.   Cataract    Complication of anesthesia    has had problems in the past. can tolerate propofol    Environmental allergies    Insomnia    Melanoma in situ (HCC) 05/07/2021   R med calf, EXC 06/17/2021   Osteopenia    Restless leg syndrome    Stress incontinence    Willis-Ekbom syndrome     Past Surgical History:  Procedure Laterality Date   CATARACT EXTRACTION W/PHACO Left 10/20/2017   Procedure: CATARACT EXTRACTION PHACO AND INTRAOCULAR LENS PLACEMENT (IOC);  Surgeon: Jaye Fallow, MD;  Location: [facility] ORS;  Service: Ophthalmology;  Laterality: Left;  US  00:39.7 AP% 12.9 CDE 5.09 Fluid Pack Lot # [id number]   CATARACT EXTRACTION W/PHACO Right 02/19/2021   Procedure: CATARACT EXTRACTION PHACO AND INTRAOCULAR LENS PLACEMENT (IOC) RIGHT;  Surgeon: Jaye Fallow, MD;  Location: [facility];  Service: Ophthalmology;  Laterality: Right;  6.15 0:38.6   COLONOSCOPY      ESOPHAGOGASTRODUODENOSCOPY (EGD) WITH PROPOFOL  N/A 12/15/2022   Procedure: ESOPHAGOGASTRODUODENOSCOPY (EGD) WITH PROPOFOL ;  Surgeon: Unk Corinn Skiff, MD;  Location: [facility] ENDOSCOPY;  Service: Gastroenterology;  Laterality: N/A;   EYE SURGERY     SKIN CANCER EXCISION     TONSILLECTOMY     TONSILLECTOMY AND ADENOIDECTOMY  1944    Allergies  Allergen Reactions   Bupropion Anxiety and Other (See Comments)    Aggitation, nausea, light headed   Lactose Intolerance (Gi) Other (See Comments)    GI symptoms   Sulfa Antibiotics Other (See Comments)    Reaction in the past (unknown)   Zolpidem  Other (See Comments)    depression     Physical Exam: General: The patient is alert and oriented x3 in no acute distress.  Dermatology: Skin is warm, dry and supple bilateral lower extremities.  No open wounds or lesions  Vascular: Palpable pedal pulses bilaterally. Capillary refill within normal limits.  Moderate edema noted.  No erythema  Neurological: Grossly intact via light touch  Musculoskeletal Exam: Minimal tenderness with palpation throughout the TMT and Lisfranc joint.  No crepitus with palpation or motion.  Advanced severe arthritic changes noted throughout the midfoot  [facility] Outside Information   X-ray foot left 3 plus views 04/11/2024  Anatomical Region Laterality Modality  Foot Left -- Radiographic Imaging  Toe Left -- --  Ankle Left -- --  ORTHO Foot Left -- --   Impression  Findings suggestive of Lisfranc injury.  Recommend further evaluation with MRI.  BRENDAN CHRISTOPHER CLINE, MD   Narrative  EXAM: X-ray foot left 3 plus views  INDICATION: injury Foot injury, left, initial encounter [D00.077J (ICD-10-CM)]  COMPARISON: 04/19/2021  FINDINGS: There is mild widening of the 1st-2nd metatarsal interval in comparison to prior with irregular calcification in this area. Moderate swelling about the midfoot No aggressive osseus lesion. Exam End:  04/11/24 18:39 Last Resulted: 04/12/24 13:52  Received From: [facility]     Assessment/Plan of Care: 1.  Lisfranc injury left.  DOI: 04/08/2024; significantly improved  -Patient evaluated.  X-rays reviewed -She reports that she is approximately 80% better and has minimal tenderness or pain with ambulation.  She may discontinue the postop shoe and wear good supportive tennis shoes and her customized insoles that she purchased from [employer] on [address] [place].  Continue -She also reports that meloxicam  was hard on her stomach.  Okay to discontinue -Okay to slowly resume normal activity and steadily increase activity depending on how her foot tolerates it -Compression ankle sleeve dispensed.  Wear daily -Return to clinic PRN        Thresa EMERSON Sar, DPM [facility]  Dr. Thresa EMERSON Sar, DPM    [address] [place], [place] [address]                Office [phone]  Fax [phone]

## 2024-06-22 ENCOUNTER — Other Ambulatory Visit: Payer: Self-pay | Admitting: Obstetrics and Gynecology

## 2024-06-22 DIAGNOSIS — Z1231 Encounter for screening mammogram for malignant neoplasm of breast: Secondary | ICD-10-CM

## 2024-08-09 ENCOUNTER — Other Ambulatory Visit: Payer: Self-pay | Admitting: Internal Medicine

## 2024-08-09 DIAGNOSIS — Z1231 Encounter for screening mammogram for malignant neoplasm of breast: Secondary | ICD-10-CM

## 2024-08-31 ENCOUNTER — Ambulatory Visit
Admission: RE | Admit: 2024-08-31 | Discharge: 2024-08-31 | Disposition: A | Discharge status: Home / Self Care | Source: Ambulatory Visit | Attending: Internal Medicine | Admitting: Internal Medicine

## 2024-08-31 DIAGNOSIS — Z1231 Encounter for screening mammogram for malignant neoplasm of breast: Secondary | ICD-10-CM | POA: Diagnosis present

## 2024-09-05 ENCOUNTER — Other Ambulatory Visit: Payer: Self-pay | Admitting: Internal Medicine

## 2024-09-05 DIAGNOSIS — R928 Other abnormal and inconclusive findings on diagnostic imaging of breast: Secondary | ICD-10-CM

## 2024-09-06 ENCOUNTER — Other Ambulatory Visit: Payer: Self-pay | Admitting: Internal Medicine

## 2024-09-06 DIAGNOSIS — R928 Other abnormal and inconclusive findings on diagnostic imaging of breast: Secondary | ICD-10-CM

## 2024-09-12 ENCOUNTER — Inpatient Hospital Stay: Admission: RE | Admit: 2024-09-12

## 2024-09-12 ENCOUNTER — Other Ambulatory Visit

## 2024-09-20 ENCOUNTER — Encounter: Admitting: Dermatology
